# Patient Record
Sex: Female | Born: 1935 | Race: White | Hispanic: No | Smoking: Current every day smoker
Health system: Southern US, Community
[De-identification: ages and names within clinical notes are randomized; demographics above are authoritative.]

## PROBLEM LIST (undated history)

## (undated) DIAGNOSIS — K222 Esophageal obstruction: Secondary | ICD-10-CM

## (undated) DIAGNOSIS — I2510 Atherosclerotic heart disease of native coronary artery without angina pectoris: Secondary | ICD-10-CM

## (undated) DIAGNOSIS — Z950 Presence of cardiac pacemaker: Secondary | ICD-10-CM

## (undated) DIAGNOSIS — E039 Hypothyroidism, unspecified: Secondary | ICD-10-CM

## (undated) DIAGNOSIS — I351 Nonrheumatic aortic (valve) insufficiency: Secondary | ICD-10-CM

## (undated) DIAGNOSIS — Z9889 Other specified postprocedural states: Secondary | ICD-10-CM

## (undated) DIAGNOSIS — D1720 Benign lipomatous neoplasm of skin and subcutaneous tissue of unspecified limb: Secondary | ICD-10-CM

## (undated) DIAGNOSIS — E785 Hyperlipidemia, unspecified: Secondary | ICD-10-CM

## (undated) DIAGNOSIS — E079 Disorder of thyroid, unspecified: Secondary | ICD-10-CM

## (undated) DIAGNOSIS — Z973 Presence of spectacles and contact lenses: Secondary | ICD-10-CM

## (undated) DIAGNOSIS — Z972 Presence of dental prosthetic device (complete) (partial): Secondary | ICD-10-CM

## (undated) DIAGNOSIS — C801 Malignant (primary) neoplasm, unspecified: Secondary | ICD-10-CM

## (undated) DIAGNOSIS — I10 Essential (primary) hypertension: Secondary | ICD-10-CM

## (undated) DIAGNOSIS — J381 Polyp of vocal cord and larynx: Secondary | ICD-10-CM

## (undated) DIAGNOSIS — K449 Diaphragmatic hernia without obstruction or gangrene: Secondary | ICD-10-CM

## (undated) DIAGNOSIS — B029 Zoster without complications: Secondary | ICD-10-CM

## (undated) DIAGNOSIS — K2960 Other gastritis without bleeding: Secondary | ICD-10-CM

## (undated) DIAGNOSIS — I4891 Unspecified atrial fibrillation: Secondary | ICD-10-CM

## (undated) DIAGNOSIS — M1990 Unspecified osteoarthritis, unspecified site: Secondary | ICD-10-CM

## (undated) HISTORY — PX: CHOLECYSTECTOMY: SHX55

## (undated) HISTORY — DX: Presence of spectacles and contact lenses: Z97.3

## (undated) HISTORY — PX: BACK SURGERY: SHX140

## (undated) HISTORY — PX: ABDOMINAL HYSTERECTOMY: SHX81

## (undated) HISTORY — DX: Presence of cardiac pacemaker: Z95.0

## (undated) HISTORY — PX: GALLBLADDER SURGERY: SHX652

## (undated) HISTORY — PX: COLONOSCOPY: SHX174

## (undated) HISTORY — DX: Unspecified osteoarthritis, unspecified site: M19.90

## (undated) HISTORY — DX: Other specified postprocedural states: Z98.890

## (undated) HISTORY — DX: Benign lipomatous neoplasm of skin and subcutaneous tissue of unspecified limb: D17.20

## (undated) HISTORY — DX: Zoster without complications: B02.9

## (undated) HISTORY — DX: Presence of dental prosthetic device (complete) (partial): Z97.2

## (undated) HISTORY — PX: CATARACT EXTRACTION: SUR2

## (undated) HISTORY — DX: Disorder of thyroid, unspecified: E07.9

## (undated) HISTORY — PX: THYROID SURGERY: SHX805

## (undated) HISTORY — PX: ESOPHAGOGASTRODUODENOSCOPY: SHX1529

---

## 2007-02-03 ENCOUNTER — Emergency Department: Payer: Self-pay | Admitting: Emergency Medicine

## 2007-02-03 ENCOUNTER — Other Ambulatory Visit: Payer: Self-pay

## 2007-02-11 ENCOUNTER — Ambulatory Visit: Payer: Self-pay | Admitting: Specialist

## 2007-02-25 ENCOUNTER — Ambulatory Visit: Payer: Self-pay | Admitting: Unknown Physician Specialty

## 2007-03-29 ENCOUNTER — Ambulatory Visit: Payer: Self-pay | Admitting: Unknown Physician Specialty

## 2007-07-13 ENCOUNTER — Ambulatory Visit: Payer: Self-pay | Admitting: Unknown Physician Specialty

## 2009-04-09 ENCOUNTER — Ambulatory Visit: Payer: Self-pay | Admitting: Rheumatology

## 2009-06-18 ENCOUNTER — Ambulatory Visit: Payer: Self-pay | Admitting: Internal Medicine

## 2009-09-11 ENCOUNTER — Ambulatory Visit: Payer: Self-pay | Admitting: Pain Medicine

## 2009-10-01 ENCOUNTER — Ambulatory Visit: Payer: Self-pay | Admitting: Pain Medicine

## 2009-10-18 ENCOUNTER — Other Ambulatory Visit: Payer: Self-pay | Admitting: Pain Medicine

## 2009-10-18 ENCOUNTER — Ambulatory Visit: Payer: Self-pay | Admitting: Pain Medicine

## 2009-10-25 ENCOUNTER — Ambulatory Visit: Payer: Self-pay | Admitting: Pain Medicine

## 2009-11-12 ENCOUNTER — Ambulatory Visit: Payer: Self-pay | Admitting: Pain Medicine

## 2009-12-06 ENCOUNTER — Ambulatory Visit: Payer: Self-pay | Admitting: Pain Medicine

## 2009-12-25 ENCOUNTER — Ambulatory Visit: Payer: Self-pay | Admitting: Gastroenterology

## 2010-01-01 ENCOUNTER — Ambulatory Visit: Payer: Self-pay | Admitting: Gastroenterology

## 2010-01-31 ENCOUNTER — Ambulatory Visit: Payer: Self-pay | Admitting: Unknown Physician Specialty

## 2010-06-13 ENCOUNTER — Ambulatory Visit: Payer: Self-pay | Admitting: Internal Medicine

## 2010-07-08 ENCOUNTER — Ambulatory Visit: Payer: Self-pay | Admitting: Obstetrics and Gynecology

## 2010-07-16 ENCOUNTER — Emergency Department: Payer: Self-pay | Admitting: Unknown Physician Specialty

## 2010-08-20 ENCOUNTER — Ambulatory Visit: Payer: Self-pay | Admitting: Unknown Physician Specialty

## 2010-08-27 LAB — PATHOLOGY REPORT

## 2010-10-28 LAB — PROTIME-INR: INR: 1.09 (ref 0.00–1.49)

## 2010-10-28 LAB — COMPREHENSIVE METABOLIC PANEL
ALT: 12 U/L (ref 0–35)
AST: 17 U/L (ref 0–37)
Albumin: 3.6 g/dL (ref 3.5–5.2)
CO2: 29 mEq/L (ref 19–32)
Chloride: 104 mEq/L (ref 96–112)
GFR calc Af Amer: >60 mL/min (ref >60)
GFR calc non Af Amer: >60 mL/min (ref >60)
Potassium: 4.5 mEq/L (ref 3.5–5.1)
Sodium: 141 mEq/L (ref 135–145)
Total Bilirubin: 0.9 mg/dL (ref 0.3–1.2)

## 2010-10-28 LAB — URINALYSIS, ROUTINE W REFLEX MICROSCOPIC
Bilirubin Urine: NEGATIVE
Hgb urine dipstick: NEGATIVE
Ketones, ur: NEGATIVE mg/dL
Nitrite: NEGATIVE
Urobilinogen, UA: 0.2 mg/dL (ref 0.0–1.0)

## 2010-10-28 LAB — CBC
HCT: 40.4 % (ref 36.0–46.0)
Hemoglobin: 13.5 g/dL (ref 12.0–15.0)
MCV: 91.2 fL (ref 78.0–100.0)
RBC: 4.43 MIL/uL (ref 3.87–5.11)
WBC: 8.1 10*3/uL (ref 4.0–10.5)

## 2010-10-28 LAB — DIFFERENTIAL
Eosinophils Relative: 2 % (ref 0–5)
Lymphocytes Relative: 32 % (ref 12–46)
Lymphs Abs: 2.6 10*3/uL (ref 0.7–4.0)
Neutro Abs: 4.8 10*3/uL (ref 1.7–7.7)
Neutrophils Relative %: 60 % (ref 43–77)

## 2010-10-28 LAB — SURGICAL PCR SCREEN: MRSA, PCR: NEGATIVE

## 2010-10-28 LAB — URINE MICROSCOPIC-ADD ON

## 2010-11-04 ENCOUNTER — Observation Stay (HOSPITAL_COMMUNITY)
Admission: RE | Admit: 2010-11-04 | Discharge: 2010-11-05 | Disposition: A | Discharge status: Home / Self Care | Payer: Medicare Other | Attending: Surgery | Admitting: Surgery

## 2010-11-04 ENCOUNTER — Other Ambulatory Visit: Payer: Self-pay | Admitting: Surgery

## 2010-11-04 DIAGNOSIS — J449 Chronic obstructive pulmonary disease, unspecified: Secondary | ICD-10-CM | POA: Insufficient documentation

## 2010-11-04 DIAGNOSIS — J4489 Other specified chronic obstructive pulmonary disease: Secondary | ICD-10-CM | POA: Insufficient documentation

## 2010-11-04 DIAGNOSIS — I10 Essential (primary) hypertension: Secondary | ICD-10-CM | POA: Insufficient documentation

## 2010-11-04 DIAGNOSIS — I4891 Unspecified atrial fibrillation: Secondary | ICD-10-CM | POA: Insufficient documentation

## 2010-11-04 DIAGNOSIS — E041 Nontoxic single thyroid nodule: Principal | ICD-10-CM | POA: Insufficient documentation

## 2010-11-04 DIAGNOSIS — H409 Unspecified glaucoma: Secondary | ICD-10-CM | POA: Insufficient documentation

## 2010-11-04 DIAGNOSIS — F17200 Nicotine dependence, unspecified, uncomplicated: Secondary | ICD-10-CM | POA: Insufficient documentation

## 2010-11-04 DIAGNOSIS — Z01812 Encounter for preprocedural laboratory examination: Secondary | ICD-10-CM | POA: Insufficient documentation

## 2010-11-05 LAB — CALCIUM: Calcium: 9.1 mg/dL (ref 8.4–10.5)

## 2010-11-21 NOTE — Discharge Summary (Signed)
  NAMEDESTENI, PISCOPO               ACCOUNT NO.:  [id number]  MEDICAL RECORD NO.:  [id number]           PATIENT TYPE:  I  LOCATION:  1533                         FACILITY:  [facility]  PHYSICIAN:  Velora Heckler, MD      DATE OF BIRTH:  Feb 03, 1932  DATE OF ADMISSION:  11/04/2010 DATE OF DISCHARGE:  11/05/2010                              DISCHARGE SUMMARY   REASON FOR ADMISSION:  Thyroid nodule with indeterminate cytopathology  BRIEF HISTORY:  The patient is a 77 year old white female from [place], [place].  She has had a known history of thyroid nodules over the past 10 years.  She has had a gradually enlarging mass on the left side that now measures 4.6 cm.  She underwent fine-needle aspiration cytology which was indeterminate.  She now comes to Surgery for resection for definitive diagnosis.  BODY OF REPORT:  The patient was admitted on November 04, 2010, and taken directly to the operating room.  She underwent total thyroidectomy with resection of a left anterior mediastinal mass.  Postoperatively, she remained stable.  She tolerated a regular diet.  Her calcium levels remained stable at 9.0 and 9.1.  She was prepared for discharge home on the first postoperative day.  DISCHARGE/PLAN:  The patient is discharged home on November 05, 2010, in good condition, tolerating a regular diet, and ambulating independently.  DISCHARGE MEDICATIONS:  Include Ultram as needed for pain and Synthroid 75 mcg daily.  She will also take Tums 2 tablets 3 times daily.  The patient will return to see me at my office at [facility] in 2 to 3 weeks.  FINAL DIAGNOSIS:  Thyroid nodules, left neck mass, final pathologic results pending at the time of discharge.  CONDITION ON DISCHARGE:  Good.     Velora Heckler, MD     TMG/MEDQ  D:  11/05/2010  T:  11/05/2010  Job:  [id number]  cc:   Ellie Lunch, M.D.  Bethann Punches Fax: [phone]  Dr. Elliot Gurney Fax:  [phone]  Electronically Signed by Darnell Level MD on 11/21/2010 10:28:01 AM

## 2010-11-21 NOTE — Op Note (Signed)
NAMEJANIELLE, Elizabeth Boone               ACCOUNT NO.:  [id number]  MEDICAL RECORD NO.:  [id number]           PATIENT TYPE:  O  LOCATION:  DAYL                         FACILITY:  [facility]  PHYSICIAN:  Velora Heckler, MD      DATE OF BIRTH:  1931/12/06  DATE OF PROCEDURE: DATE OF DISCHARGE:                              OPERATIVE REPORT   PREOPERATIVE DIAGNOSIS:  Thyroid nodules.  POSTOPERATIVE DIAGNOSIS:  Thyroid nodules, left mediastinal mass.  PROCEDURE: 1. Total thyroidectomy. 2. Resection, left anterior mediastinal mass.  SURGEON:  Velora Heckler, MD  ANESTHESIA:  Jill Side, MD  ESTIMATED BLOOD LOSS:  100 mL.  PREPARATION:  ChloraPrep.  COMPLICATIONS:  None.  INDICATIONS:  The patient is a 77 year old white female from [place], [place].  She is referred by Dr. Ellie Lunch for evaluation of enlarging left-sided thyroid nodule with indeterminate cytopathology. The patient has had a history of thyroid nodules dating back approximately 10 years.  She has had intermittent problems with hoarseness which has become more significant over the past few months. The patient has been followed with ultrasound showing bilateral thyroid nodules as well as a dominant mass in the left lower neck.  The patient has had previous fine-needle aspiration biopsies at [facility] which showed indeterminate cytopathology.  The patient now comes to Surgery for resection.  BODY OF REPORT:  Procedure was done in OR #2 at the [facility].  The patient was brought to the operating room, placed in supine position on the operating room table.  Following administration of general anesthesia, the patient is positioned and then prepped and draped in the usual strict aseptic fashion.  After ascertaining that an adequate level of anesthesia has been achieved, a Kocher incision was made with a #15 blade.  Dissection was carried through subcutaneous tissues  and platysma.  Hemostasis was obtained with electrocautery.  Subplatysmal flaps were developed cephalad and caudad and Mahorner retractor was placed for exposure.  Strap muscles were incised in the midline and dissection was begun on the left side.  There was a relatively small left thyroid lobe.  Venous structures were divided between small and medium Ligaclips.  Superior pole was dissected out and superior pole vessels divided between small Ligaclips with the harmonic scalpel.  Branches of the inferior thyroid artery are divided between small Ligaclips.  Gland was mobilized and rotated anteriorly. Isthmus was elevated off of the trachea with electrocautery.  There was a small pyramidal lobe which was dissected off the anterior thyroid cartilage and resected en bloc with the left lobe and isthmus.  Further palpation in the left neck, however, shows an enlarged mass extending from just below the thyroid on the left into the anterior mediastinum.  This tracks along the lateral trachea and esophagus.  It displaces the recurrent nerve laterally and posteriorly.  With some difficulty, this mass is mobilized out of the anterior mediastinum. Harmonic scalpel was used to divide adventitia around the mass. Vascular structures were divided between Ligaclips with the harmonic scalpel.  The entire mass is mobilized up and into the neck.  It has the appearance of a multicystic ectopic thyroid tissue, although this was difficult to determine at the time of surgery.  This was fully mobilized and resected.  It is submitted separately labeled as left anterior mediastinal mass to pathology for review.  Good hemostasis was achieved in the left neck.  Dry pack is placed in the left neck.  Next, we turned our attention to the right neck.  Strap muscles were reflected laterally exposing the right thyroid lobe.  Middle thyroid vein is divided between medium Ligaclips with the harmonic scalpel. Superior  pole was dissected out and vessels were divided individually between small and medium Ligaclips with the harmonic scalpel.  Gland was rotated anteriorly.  Branches of the inferior thyroid artery are divided between small Ligaclips with the harmonic scalpel.  Recurrent nerve was identified and preserved.  Ligament of Allyson Sabal is released and the gland was mobilized up and onto the anterior trachea from which it is completely excised with the electrocautery.  Suture was used to mark the left superior pole.  Entire thyroid gland was submitted to pathology for review.  Neck is irrigated with warm saline.  Good hemostasis was achieved bilaterally.  Surgicel was placed in the operative field.  Strap muscles were reapproximated in the midline with interrupted 3-0 Vicryl sutures. Platysma was closed with interrupted 3-0 Vicryl sutures.  Skin was closed with running 4-0 Monocryl subcuticular suture.  Wound was washed and dried and Dermabond was applied.  The patient was awakened from anesthesia and brought to the recovery room.  The patient tolerated the procedure well.     Velora Heckler, MD     TMG/MEDQ  D:  11/04/2010  T:  11/04/2010  Job:  [id number]  cc:   Ellie Lunch, M.D.  Bethann Punches Fax: [phone]  Dr. Linus Salmons Gould D. Hegland, MD [address] [place], [place] [address]  Electronically Signed by Darnell Level MD on 11/21/2010 10:28:13 AM

## 2011-01-29 ENCOUNTER — Encounter (INDEPENDENT_AMBULATORY_CARE_PROVIDER_SITE_OTHER): Payer: Self-pay | Admitting: Surgery

## 2011-05-27 ENCOUNTER — Other Ambulatory Visit (INDEPENDENT_AMBULATORY_CARE_PROVIDER_SITE_OTHER): Payer: Self-pay | Admitting: Surgery

## 2011-05-27 NOTE — Telephone Encounter (Signed)
Patient will see Dr. Bethann Punches 05/28/2011. She has already had labs performed by him and will see him for future refills. RMP

## 2011-08-28 ENCOUNTER — Ambulatory Visit: Payer: Self-pay | Admitting: Unknown Physician Specialty

## 2011-08-28 DIAGNOSIS — I499 Cardiac arrhythmia, unspecified: Secondary | ICD-10-CM

## 2011-09-02 ENCOUNTER — Ambulatory Visit: Payer: Self-pay | Admitting: Unknown Physician Specialty

## 2011-09-15 ENCOUNTER — Ambulatory Visit: Payer: Self-pay | Admitting: Oncology

## 2011-09-19 ENCOUNTER — Ambulatory Visit: Payer: Self-pay | Admitting: Oncology

## 2011-09-30 ENCOUNTER — Ambulatory Visit: Payer: Self-pay | Admitting: Oncology

## 2011-10-28 LAB — CBC CANCER CENTER
Basophil #: 0 x10 3/mm (ref 0.0–0.1)
Basophil %: 0.4 %
Eosinophil %: 2.6 %
HCT: 40 % (ref 35.0–47.0)
Lymphocyte #: 2 x10 3/mm (ref 1.0–3.6)
MCHC: 33.9 g/dL (ref 32.0–36.0)
MCV: 92 fL (ref 80–100)
Monocyte %: 6.2 %
Neutrophil #: 4.7 x10 3/mm (ref 1.4–6.5)
Platelet: 171 x10 3/mm (ref 150–440)
RDW: 15.8 % — ABNORMAL HIGH (ref 11.5–14.5)
WBC: 7.5 x10 3/mm (ref 3.6–11.0)

## 2011-10-30 DIAGNOSIS — I480 Paroxysmal atrial fibrillation: Secondary | ICD-10-CM | POA: Insufficient documentation

## 2011-10-30 DIAGNOSIS — I4891 Unspecified atrial fibrillation: Secondary | ICD-10-CM | POA: Insufficient documentation

## 2011-10-31 ENCOUNTER — Ambulatory Visit: Payer: Self-pay | Admitting: Oncology

## 2011-11-11 LAB — CBC CANCER CENTER
Basophil %: 0.3 %
Eosinophil #: 0.1 x10 3/mm (ref 0.0–0.7)
Eosinophil %: 1.1 %
HGB: 13.7 g/dL (ref 12.0–16.0)
Lymphocyte %: 20 %
MCH: 31.1 pg (ref 26.0–34.0)
Monocyte #: 0.5 x10 3/mm (ref 0.0–0.7)
Neutrophil #: 5.7 x10 3/mm (ref 1.4–6.5)
Neutrophil %: 72.1 %
RBC: 4.41 10*6/uL (ref 3.80–5.20)

## 2011-11-28 ENCOUNTER — Ambulatory Visit: Payer: Self-pay | Admitting: Oncology

## 2011-12-29 ENCOUNTER — Ambulatory Visit: Payer: Self-pay | Admitting: Oncology

## 2012-01-28 ENCOUNTER — Ambulatory Visit: Payer: Self-pay | Admitting: Oncology

## 2012-06-08 ENCOUNTER — Ambulatory Visit: Payer: Self-pay | Admitting: Internal Medicine

## 2012-06-10 ENCOUNTER — Ambulatory Visit: Payer: Self-pay | Admitting: Oncology

## 2012-06-29 ENCOUNTER — Ambulatory Visit: Payer: Self-pay | Admitting: Oncology

## 2012-11-08 ENCOUNTER — Observation Stay: Payer: Self-pay | Admitting: Internal Medicine

## 2012-11-08 ENCOUNTER — Ambulatory Visit: Payer: Self-pay | Admitting: Emergency Medicine

## 2012-11-08 LAB — COMPREHENSIVE METABOLIC PANEL
Albumin: 3.5 g/dL (ref 3.4–5.0)
Alkaline Phosphatase: 111 U/L (ref 50–136)
BUN: 11 mg/dL (ref 7–18)
Bilirubin,Total: 0.6 mg/dL (ref 0.2–1.0)
Calcium, Total: 8.8 mg/dL (ref 8.5–10.1)
Creatinine: 0.5 mg/dL — ABNORMAL LOW (ref 0.60–1.30)
EGFR (African American): >60
Glucose: 85 mg/dL (ref 65–99)
Potassium: 4.6 mmol/L (ref 3.5–5.1)
SGPT (ALT): 14 U/L (ref 12–78)
Total Protein: 6.9 g/dL (ref 6.4–8.2)

## 2012-11-08 LAB — CBC
MCHC: 34 g/dL (ref 32.0–36.0)
MCV: 89 fL (ref 80–100)
RBC: 4.18 10*6/uL (ref 3.80–5.20)
WBC: 7 10*3/uL (ref 3.6–11.0)

## 2012-11-08 LAB — LIPASE, BLOOD: Lipase: 107 U/L (ref 73–393)

## 2012-11-08 LAB — CK TOTAL AND CKMB (NOT AT ARMC)
CK, Total: 85 U/L (ref 21–215)
CK, Total: 48 U/L (ref 21–215)

## 2012-11-09 LAB — BASIC METABOLIC PANEL
Anion Gap: 5 — ABNORMAL LOW (ref 7–16)
BUN: 13 mg/dL (ref 7–18)
Creatinine: 0.71 mg/dL (ref 0.60–1.30)
Osmolality: 275 (ref 275–301)

## 2012-11-09 LAB — LIPID PANEL
Cholesterol: 122 mg/dL (ref 0–200)
Ldl Cholesterol, Calc: 60 mg/dL (ref 0–100)

## 2012-11-09 LAB — TROPONIN I: Troponin-I: <0.02 ng/mL

## 2012-12-15 ENCOUNTER — Ambulatory Visit: Payer: Self-pay | Admitting: Gastroenterology

## 2013-01-03 ENCOUNTER — Ambulatory Visit: Payer: Self-pay | Admitting: Radiation Oncology

## 2013-01-27 ENCOUNTER — Ambulatory Visit: Payer: Self-pay | Admitting: Radiation Oncology

## 2013-02-09 ENCOUNTER — Ambulatory Visit: Payer: Self-pay | Admitting: Emergency Medicine

## 2013-03-15 ENCOUNTER — Ambulatory Visit: Payer: Self-pay

## 2013-06-17 ENCOUNTER — Ambulatory Visit: Payer: Self-pay | Admitting: Internal Medicine

## 2013-07-05 ENCOUNTER — Ambulatory Visit: Payer: Self-pay | Admitting: Radiation Oncology

## 2013-07-30 ENCOUNTER — Ambulatory Visit: Payer: Self-pay | Admitting: Radiation Oncology

## 2013-09-25 ENCOUNTER — Emergency Department: Payer: Self-pay | Admitting: Emergency Medicine

## 2013-09-28 LAB — BETA STREP CULTURE(ARMC)

## 2013-09-29 DIAGNOSIS — D1720 Benign lipomatous neoplasm of skin and subcutaneous tissue of unspecified limb: Secondary | ICD-10-CM

## 2013-09-29 HISTORY — DX: Benign lipomatous neoplasm of skin and subcutaneous tissue of unspecified limb: D17.20

## 2014-02-13 ENCOUNTER — Ambulatory Visit: Payer: Self-pay | Admitting: Physician Assistant

## 2014-02-19 ENCOUNTER — Ambulatory Visit: Payer: Self-pay | Admitting: Physician Assistant

## 2014-03-26 ENCOUNTER — Ambulatory Visit: Payer: Self-pay | Admitting: Physician Assistant

## 2014-06-15 ENCOUNTER — Ambulatory Visit: Payer: Self-pay | Admitting: Gastroenterology

## 2014-06-21 ENCOUNTER — Ambulatory Visit: Payer: Self-pay | Admitting: Internal Medicine

## 2014-07-05 ENCOUNTER — Ambulatory Visit: Payer: Self-pay | Admitting: Radiation Oncology

## 2014-07-30 ENCOUNTER — Ambulatory Visit: Payer: Self-pay | Admitting: Radiation Oncology

## 2014-08-04 ENCOUNTER — Ambulatory Visit: Payer: Self-pay | Admitting: Gastroenterology

## 2014-09-29 DIAGNOSIS — Z79899 Other long term (current) drug therapy: Secondary | ICD-10-CM | POA: Insufficient documentation

## 2014-11-03 ENCOUNTER — Ambulatory Visit: Payer: Self-pay | Admitting: Gastroenterology

## 2015-01-19 NOTE — Discharge Summary (Signed)
PATIENT NAME:  Elizabeth Boone, Elizabeth Boone MR#:  [id number] DATE OF BIRTH:  1932/04/20  DATE OF ADMISSION:  11/08/2012 DATE OF DISCHARGE:  11/09/2012  DISCHARGE DIAGNOSES: 1.  Atypical chest pain.  2.  Gastroesophageal reflux disease.  3.  Hyperlipidemia.  4.  Hypertension.  5.  Hypothyroidism.  6.  Chronic atrial fibrillation.  DISCHARGE MEDICATIONS:  Sertraline 50 mg daily, B complex daily, Synthroid 75 mcg daily, simvastatin 40 mg at bedtime, Toprol-XL 25 mg daily, tramadol 50 mg twice daily as needed, Xarelto 10 mg daily, Protonix 40 mg twice daily.   REASON FOR ADMISSION:  The patient is a 81 year old female presents with atypical chest pain.  Please see history and physical for history of present illness, past medical history, physical exam.   HOSPITAL COURSE:  The patient was admitted.  Enzymes were negative.  She describes a lot of burping and belching the last couple weeks, came to the ED, work-up was negative.  EKG showed chronic A-Fib with no ischemic changes, underwent Lexiscan which showed no ischemia.  Protonix twice daily will replace her omeprazole and she will follow up with Dr. Sabra Heck in two weeks.   DICTATION ENDS HERE    ____________________________ Rusty Aus, MD mfm:ea D: 11/09/2012 17:29:18 ET T: 11/09/2012 23:40:43 ET JOB#: [id number]  cc: Rusty Aus, MD, <Dictator> Cay[place] Kielbasa Roselee Culver MD ELECTRONICALLY SIGNED 11/10/2012 6:55

## 2015-01-19 NOTE — H&P (Signed)
PATIENT NAME:  Elizabeth Boone, Elizabeth Boone MR#:  [id number] DATE OF BIRTH:  Aug 04, 1932  DATE OF ADMISSION:  11/08/2012  PRIMARY CARE PROVIDER: Dr. Sabra Heck   ED REFERRING DOCTOR: Dr. Michel Santee  CHIEF COMPLAINT: Left-sided chest pain.   HISTORY OF PRESENT ILLNESS: The patient is a 81 year old white female with history of A. fib, coronary artery disease, hypertension, hyperlipidemia, hypothyroidism, previous history of vocal cord cancer, COPD, who was in her usual state of health until this morning when she woke up around 2:00 a.m. with left-sided chest pain.  It was sharp in nature and severe.  The patient reports that that then she got up and walked a little and then she had some belching and belched and the pain improved.  She has had some midepigastric pain and has had some on and off gas.  The patient has taken rolaids and Mylanta, has not helped much.  She reports that prior to this episode she has not had any chest pain or dyspnea on exertion.  She otherwise denies any nausea, vomiting or diarrhea.  Denies any lower extremity swelling.    PAST MEDICAL HISTORY:  1.  History of atrial fibrillation.  2.  Hypertension.  3.  Hyperlipidemia.  4.  Hypothyroidism.  5.  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  6.  Chronic obstructive pulmonary disease.  7.  Peripheral neuropathy.  8.  History of shingles.  9.  Status post pacemaker placement due to low heart rate.   PAST SURGICAL HISTORY:  1.  Status post thyroidectomy for thyroid cancer.  2.  Status post vocal cord biopsy.  3.  Bilateral cataract surgery.  4.  Status post cholecystectomy.  5.  Status post hysterectomy.  6.  Status post back surgery.   CURRENT MEDICATIONS: She is on:  1.  Levothyroxine 75 mcg daily.  2.  Metoprolol succinate extended-release 25 p.o. daily.  3.  Omeprazole 20 daily.  4.  Sertraline 50 one tab p.o. every morning.  5.  Simvastatin 40 every evening.  6.  Vitamin B-complex daily.  7.  Tramadol 50 one  tab p.o. b.i.d. as needed for pain.  8.  Xarelto 10 daily.   SOCIAL HISTORY: Smokes a 1/2 pack per day.  No alcohol or drug use.   FAMILY HISTORY: Positive for hypertension.   REVIEW OF SYSTEMS:  CONSTITUTIONAL: Complains of no fevers, no fatigue, no weakness, no weight loss, no weight gain.  EYES: No blurred or double vision.  No pain.  No redness.  No inflammation.  EARS, NOSE, AND THROAT: No tinnitus.  No ear pain.  No difficulty with hearing.  No difficulty swallowing.  RESPIRATORY: Denies any cough, wheezing, hemoptysis.  No COPD.  No TB.  CARDIOVASCULAR: Chest pain as above.  No orthopnea, no edema.  Does have a history of atrial fibrillation.  GASTROINTESTINAL: No nausea, vomiting, diarrhea.  Complains of some epigastric discomfort.  No hematemesis,  No melena.  No changes in bowel habits.  GENITOURINARY: Denies any dysuria, hematuria, renal calculus or frequency.  ENDOCRINOLOGIC: Denies any polyuria or nocturia but does have history of hypothyroidism.  HEME/LYMPH: Denies any anemia, easy bruisability or bleeding.  SKIN: No acne, no rash, no changes in mole, hair, or skin.  MUSCULOSKELETAL: Denies any pain in neck, back.  No gout.   NEUROLOGIC: No CVA.  No TIA.  No seizures.  PSYCHIATRIC: Does have depression.    PHYSICAL EXAMINATION:  VITAL SIGNS: Temperature 98, pulse 75, respirations 18, blood pressure 133/71.  O2  is 100%.  GENERAL: The patient is a 81 year old, elderly female in no acute distress.   HEAD, EYES, EARS, NOSE, AND THROAT: Head atraumatic, normocephalic.  Pupils equal, round, reactive to light and accommodation.  There is no conjunctiva pallor.  No scleral icterus.  Nasal exam shows no drainage or ulceration.  Oropharynx is clear without any exudate.  NECK: No thyromegaly.  No carotid bruits.  CARDIOVASCULAR: Regular rate and rhythm.  No murmurs, rubs, clicks, or gallops.  PMI is not displaced.  LUNGS: Clear to auscultation bilaterally without any rales, rhonchi, or  wheezing.  ABDOMEN: Soft, nontender, nondistended.  Positive bowel sounds x4.  There is no hepatosplenomegaly.   EXTREMITIES: No clubbing, cyanosis, or edema.  SKIN: No rash.  LYMPHATIC: No lymph nodes palpable.   VASCULAR: Good DP, PT pulses.  PSYCHIATRIC: Not anxious or depressed.  NEUROLOGIC: Awake, alert, and oriented x3.  No focal deficits.    EVALUATIONS: EKG shows paced rhythm without any other abnormalities.  Chest x-ray shows no acute cardiopulmonary processes.  WBC 7.0, hemoglobin 12.7, platelet count 183.  Troponin less than 0.02.  CPK is 85.  CK-MB 1.2.  Lipase 107.  BMP: Glucose 85, BUN 11, creatinine 0.50, sodium 140, potassium 4.6, chloride 108, CO2 is 26.  ALTs are normal.    ASSESSMENT AND PLAN: The patient is a 81 year old white female with history of atrial fibrillation status post pacemaker, hyperlipidemia, chronic obstructive pulmonary disease, presnts with chest pain.   1.  Chest pain, atypical, associated with some gastrointestinal symptoms but has multiple risk factors.  At this time, we will place the patient under observation.  Get a lexi mibi stress test in am  Place her on an aspirin.  2.  Atrial fibrillation: Will continue metoprolol and Xarelto.  3.  Hypertension: Continue the metoprolol as taking at home.  Blood pressure currently is stable.  4.  Hypothyroidism: Continue Synthroid.  5.  Hyperlipidemia: Continue simvastatin.  We will check a fasting lipid panel in the morning.  6.  Gastroesophageal reflux disease: Continue p.o. omeprazole.  7.  Flatulence and some gastrointestinal symptoms: We will try some Maalox with simethicone.    Note, 35 minutes spent on the history and physical.     ____________________________ Lafonda Mosses. Posey Pronto, MD shp:th D: 11/08/2012 16:46:42 ET T: 11/08/2012 18:13:56 ET JOB#: [id number]  cc: Jacqueleen Pulver H. Posey Pronto, MD, <Dictator> Alric Seton MD ELECTRONICALLY SIGNED 11/09/2012 10:23

## 2015-01-21 NOTE — Consult Note (Signed)
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   Referred by [place].  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 Tami Ribas. She presented with a 4 year history of increasing hoarseness has been a chronic smoker. She also has a history of a 30 pound weight loss over the past 3-4 years. She recently had laryngoscopy with biopsy showing severe dysplasia at least in situ squamous cell carcinoma. Case was reviewed at our tumor conference and recommendation was made for external beam radiation therapy to her larynx. She is seen today and is doing fairly well. She is having no dysphasia or head and neck pain. She continues to smoke.   Past Hx:    COPD:    Peripheral Neuropathy:    Hypothyroidism:    shingles: Oct 2011   HOH/Aids:    arrhythmia occasional atrial fib.:    Hypercholesterolemia:    HTN:    Thyroidectomy:    Vocal Cord Biopsy X 3:    Cataract Surgery Both Eyes:    Gall Bladder Surgery:    Hysterectomy:    [employer] Pacemaker:    back surgery:   Past, Family and Social History:   Past Medical History positive    Cardiovascular atrial fibrillation; hyperlipidemia; hypertension; pacemaker    Respiratory COPD    Endocrine hypothyroidism    Past Surgical History cholecystectomy; Hysterectomy, thyroidectomy, vocal cord biopsy x3    Past Medical History Comments Peripheral neuropathy, hearing aids    Family History noncontributory     Social History positive    Social History Comments Greater than 60-pack-year smoking history no significant BPH abuse history    Additional Past Medical and Surgical History Accompanied by daughter today   Allergies:   Oxycodone: Alt Ment Status  Codeine: N/V  Onions: N/V/Diarrhea  Adhesive: Other  Home Meds:  Home Medications:  traMADOL tablet 50 mg: 1 tab(s) orally 2 times a day, As Needed, Active  simvastatin 40 mg oral tablet: 1 tab(s) orally once a day after dinner, Active  naproxen 375 mg oral tablet: 1 tab(s) orally 2 times a day. Has stopped for surgery., Active  omeprazole 20 mg oral delayed release capsule: 1 tab(s) orally once a day (in the morning), Active  sertraline 50 mg oral tablet: 1 tab(s) orally once a day (in the morning), Active  Super B Complex: 1 tab(s) orally once a day after dinner, Active  levothyroxine 75 mcg (0.075 mg) oral tablet: 1 tab(s) orally once a day (in the morning), Active  furosemide 20 mg oral tablet: 1 tab(s) orally once a day (in the morning), As Needed, Active  Metoprolol Succinate ER 25 mg oral tablet, extended release: 1 tab(s) orally once a day (in the morning), Active  Aspirin 325 mg. : 1 tab(s) orally once a day, Active  Review of Systems:   General negative    Performance Status (ECOG) 0    Skin negative    Breast negative    Ophthalmologic negative  ENMT see HPI    Respiratory and Thorax see HPI    Cardiovascular see HPI    Gastrointestinal negative    Genitourinary negative    Musculoskeletal negative    Neurological see HPI    Psychiatric negative    Hematology/Lymphatics negative    Endocrine see HPI    Allergic/Immunologic negative   Nursing Notes:  Nursing Vital Signs and Chemo Nursing Nursing Notes: *CC Vital Signs Flowsheet:   09-Jan-13 15:01   Temp Temperature 97.2   Pulse Pulse 85   Respirations Respirations 20   SBP SBP 102   DBP DBP 67   Pain Scale (0-10)  0   Current Weight (kg) (kg)  70.5   Height (cm) centimeters 175.2   BSA (m2) 1.8   Physical Exam:  General/Skin/HEENT:   General normal    Skin normal    Eyes normal    Additional PE Well-developed female in NAD. Wears bilateral hearing aids. Oral cavity is clear. Patient is edentulous. No oral mucosal lesions are identified. Indirect mirror examination shows mobile cords approximating well. I do not see significant with leukoplakia this time. There some shagginess to the vocal cords bilaterally. Remainder of head and neck exam is unremarkable vallecula and base of tongue within normal limits. Neck is clear without evidence of cervical digastric or supraclavicular adenopathy. 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 Would plan on delivering 6400 cGy in 32 fractions using three-dimensional treatment planning. We have presented her case her weekly tumor conference and pathology was reviewed. Certainly from a tangential cuts it is impossible to tell whether there is in fact invasive squamous cell carcinoma but there is at least in situ carcinoma present. She also has some subglottic extension biopsy-proven report. I favor going ahead with external beam radiation therapy at this time. Risks and benefits of treatment including sore throat and some minor dysphasia was explained to the patient and her daughter and both seem to comprehend her treatment plan well. I have set her up for CT simulation early next week. I do not believe we need to do a PET/CT since the likelihood of any local metastatic  nodes involved would be extremely remote.  I would like to take this opportunity to thank you for allowing me to continue to participate in this patient's care.   CC Referral:   cc: Dr. Tami Ribas, Dr. Emily Filbert   Electronic Signatures: Baruch Gouty, Roda Shutters (MD)  (Signed 09-Jan-13 16:10)  Authored: HPI, Diagnosis, Past Hx, PFSH, Allergies, Home Meds, ROS, Nursing Notes, Physical Exam, Encounter Assessment and Plan, CC Referring Physician   Last Updated: 09-Jan-13 16:10 by Armstead Peaks (MD)

## 2015-01-22 LAB — SURGICAL PATHOLOGY

## 2015-02-08 DIAGNOSIS — Z8521 Personal history of malignant neoplasm of larynx: Secondary | ICD-10-CM | POA: Insufficient documentation

## 2015-02-08 DIAGNOSIS — Z8589 Personal history of malignant neoplasm of other organs and systems: Secondary | ICD-10-CM | POA: Insufficient documentation

## 2015-02-27 DIAGNOSIS — E782 Mixed hyperlipidemia: Secondary | ICD-10-CM | POA: Insufficient documentation

## 2015-07-19 ENCOUNTER — Encounter: Payer: Self-pay | Admitting: Radiation Oncology

## 2015-07-19 ENCOUNTER — Ambulatory Visit
Admission: RE | Admit: 2015-07-19 | Discharge: 2015-07-19 | Disposition: A | Discharge status: Home / Self Care | Payer: Medicare Other | Source: Ambulatory Visit | Attending: Radiation Oncology | Admitting: Radiation Oncology

## 2015-07-19 ENCOUNTER — Other Ambulatory Visit: Payer: Self-pay | Admitting: Internal Medicine

## 2015-07-19 VITALS — BP 113/74 | HR 86 | Temp 96.5°F | Wt 150.0 lb

## 2015-07-19 DIAGNOSIS — C329 Malignant neoplasm of larynx, unspecified: Secondary | ICD-10-CM

## 2015-07-19 DIAGNOSIS — R1084 Generalized abdominal pain: Secondary | ICD-10-CM

## 2015-07-19 DIAGNOSIS — R519 Headache, unspecified: Secondary | ICD-10-CM

## 2015-07-19 DIAGNOSIS — R51 Headache: Secondary | ICD-10-CM

## 2015-07-19 DIAGNOSIS — R634 Abnormal weight loss: Secondary | ICD-10-CM

## 2015-07-19 NOTE — Progress Notes (Signed)
Radiation Oncology Follow up Note  Name: Elizabeth Boone   Date:   07/19/2015 MRN:  [id number] DOB: [birth year]    This 81 y.o. female presents to the clinic today for follow-up for head and neck cancer squamous cell carcinoma the larynx.  REFERRING PROVIDER: No ref. provider found  HPI: Patient is a 81 year old female now out for half years having completed radiation therapy to her larynx for at least in situ squamous cell carcinoma. She is seen today and is doing well. She specifically denies head and neck pain or dysphagia. Voice continues to be somewhat raspy but is unchanged over the past year..  COMPLICATIONS OF TREATMENT: none  FOLLOW UP COMPLIANCE: keeps appointments   PHYSICAL EXAM:  BP 113/74 mmHg  Pulse 86  Temp(Src) 96.5 F (35.8 C)  Wt 150 lb 0.4 oz (68.05 kg) Oral cavity is clear no oral mucosal lesions are identified. Indirect mirror examination shows some slight swelling of the cords although both are mobile. Vallecula and base of tongue within normal limits. Neck is clear without evidence of subject gastric cervical or supraclavicular adenopathy. She's had Mohs chemosurgery in the right nasal bridge I see nothing to suggest recurrent disease in that region. Well-developed well-nourished patient in NAD. HEENT reveals PERLA, EOMI, discs not visualized.  Oral cavity is clear. No oral mucosal lesions are identified. Neck is clear without evidence of cervical or supraclavicular adenopathy. Lungs are clear to A&P. Cardiac examination is essentially unremarkable with regular rate and rhythm without murmur rub or thrill. Abdomen is benign with no organomegaly or masses noted. Motor sensory and DTR levels are equal and symmetric in the upper and lower extremities. Cranial nerves II through XII are grossly intact. Proprioception is intact. No peripheral adenopathy or edema is identified. No motor or sensory levels are noted. Crude visual fields are within normal range.  RADIOLOGY  RESULTS: No current films for review  PLAN: At the present time she continues to do well with no evidence of disease. I will see her back in 1 year for follow-up and then discontinue follow-up care. She continues close follow-up care with ENT. Patient family know to call with any concerns.    Armstead Peaks., MD

## 2015-07-24 ENCOUNTER — Ambulatory Visit
Admission: RE | Admit: 2015-07-24 | Discharge: 2015-07-24 | Disposition: A | Discharge status: Home / Self Care | Payer: Medicare Other | Source: Ambulatory Visit | Attending: Internal Medicine | Admitting: Internal Medicine

## 2015-07-24 DIAGNOSIS — R9082 White matter disease, unspecified: Secondary | ICD-10-CM | POA: Insufficient documentation

## 2015-07-24 DIAGNOSIS — R1084 Generalized abdominal pain: Secondary | ICD-10-CM

## 2015-07-24 DIAGNOSIS — R51 Headache: Secondary | ICD-10-CM

## 2015-07-24 DIAGNOSIS — G319 Degenerative disease of nervous system, unspecified: Secondary | ICD-10-CM | POA: Insufficient documentation

## 2015-07-24 DIAGNOSIS — R634 Abnormal weight loss: Secondary | ICD-10-CM | POA: Insufficient documentation

## 2015-07-24 DIAGNOSIS — R519 Headache, unspecified: Secondary | ICD-10-CM

## 2015-07-24 HISTORY — DX: Essential (primary) hypertension: I10

## 2015-07-24 HISTORY — DX: Malignant (primary) neoplasm, unspecified: C80.1

## 2015-07-24 MED ORDER — IOHEXOL 350 MG/ML SOLN
75.0000 mL | Freq: Once | INTRAVENOUS | Status: AC | PRN
  Administered 2015-07-24: 75 mL via INTRAVENOUS

## 2015-09-13 ENCOUNTER — Other Ambulatory Visit: Payer: Self-pay

## 2015-09-13 DIAGNOSIS — I495 Sick sinus syndrome: Secondary | ICD-10-CM | POA: Insufficient documentation

## 2015-09-13 DIAGNOSIS — E039 Hypothyroidism, unspecified: Secondary | ICD-10-CM | POA: Insufficient documentation

## 2015-09-13 DIAGNOSIS — I10 Essential (primary) hypertension: Secondary | ICD-10-CM | POA: Insufficient documentation

## 2015-09-18 ENCOUNTER — Ambulatory Visit (INDEPENDENT_AMBULATORY_CARE_PROVIDER_SITE_OTHER): Payer: Medicare Other | Admitting: General Surgery

## 2015-09-18 ENCOUNTER — Encounter: Payer: Self-pay | Admitting: General Surgery

## 2015-09-18 VITALS — BP 117/69 | HR 86 | Temp 97.3°F | Ht 69.0 in | Wt 154.0 lb

## 2015-09-18 DIAGNOSIS — M799 Soft tissue disorder, unspecified: Secondary | ICD-10-CM

## 2015-09-18 DIAGNOSIS — M7989 Other specified soft tissue disorders: Secondary | ICD-10-CM

## 2015-09-18 NOTE — Progress Notes (Signed)
Patient ID: Elizabeth Boone, female   DOB: 1932/02/17, 81 y.o.   MRN: [id number]  CC: Left hip soft tissue mass  HPI Elizabeth Boone is a 81 y.o. female presents to clinic for evaluation of a soft tissue mass over her left hip. Patient states that he was recently evaluated by her dermatologist who told her that she should have a checked by a surgeon. Patient states that she first noticed area after a significant fall and trauma to the area. The area is not currently painful. It has never been red and has never had any drainage. Patient thinks that the area is actually decreased in size since she first noticed it. No other significant symptoms report from the area. She currently denies any nausea, vomiting, chills, fevers, chest pain, short of breath, diarrhea, constipation.  HPI  Past Medical History  Diagnosis Date  . Previous back surgery   . Pacemaker   . Shingles   . Thyroid disease   . Wears glasses   . Hearing loss   . Arthritis   . Wears dentures   . Hypertension   . 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 Lipoma of thigh 2015    Outer    Past Surgical History  Procedure Laterality Date  . Abdominal hysterectomy    . Back surgery    . Gallbladder surgery    . Thyroid surgery      History reviewed. No pertinent family history.  Social History Social History  Substance Use Topics  . Smoking status: Current Every Day Smoker -- 1.00 packs/day  . Smokeless tobacco: None  . Alcohol Use: No    Allergies  Allergen Reactions  . Codeine   . Hydrocodone Nausea Only    Current Outpatient Prescriptions  Medication Sig Dispense Refill  . levothyroxine (SYNTHROID, LEVOTHROID) 75 MCG tablet Take 1 tablet by mouth daily.  3  . metoprolol succinate (TOPROL-XL) 25 MG 24 hr tablet     . omeprazole (PRILOSEC) 20 MG capsule Take 20 mg by mouth daily.      Marland Kitchen rOPINIRole (REQUIP) 0.25 MG tablet Take by mouth.    . sertraline (ZOLOFT) 50 MG tablet Take 50 mg by mouth daily.      .  simvastatin (ZOCOR) 40 MG tablet Take 40 mg by mouth at bedtime.      . sucralfate (CARAFATE) 1 G tablet     . traMADol (ULTRAM) 50 MG tablet Take 50 mg by mouth every 6 (six) hours as needed.      Elizabeth Boone 20 MG TABS tablet Take 1 tablet by mouth daily.     No current facility-administered medications for this visit.     Review of Systems A tie point review of systems was asked and was negative except for the findings documented in the history of present illness  Physical Exam Blood pressure 117/69, pulse 86, temperature 97.3 F (36.3 C), temperature source Oral, height 5\' 9"  (1.753 m), weight 69.854 kg (154 lb). CONSTITUTIONAL: No acute distress. EYES: Pupils are equal, round, and reactive to light, Sclera are non-icteric. EARS, NOSE, MOUTH AND THROAT: The oropharynx is clear. The oral mucosa is pink and moist. Hearing is intact to voice. LYMPH NODES:  Lymph nodes in the neck are normal. RESPIRATORY:  Lungs are clear. There is normal respiratory effort, with equal breath sounds bilaterally, and without pathologic use of accessory muscles. CARDIOVASCULAR: Heart is regular without murmurs, gallops, or rubs. GI: The abdomen is soft, nontender,  and nondistended. There are no palpable masses. There is no hepatosplenomegaly. There are normal bowel sounds in all quadrants. GU: Rectal deferred.   MUSCULOSKELETAL: Normal muscle strength and tone. No cyanosis or edema.   SKIN: 2 cm diameter soft tissue mass palpated over the left femoral head. It is soft and mobile. This is consistent with a benign lipoma or other benign soft tissue mass. NEUROLOGIC: Motor and sensation is grossly normal. Cranial nerves are grossly intact. PSYCH:  Oriented to person, place and time. Affect is normal.  Data Reviewed No images and labs reviewed. I have personally reviewed the patient's imaging, laboratory findings and medical records.    Assessment    Left hip soft tissue mass    Plan    Soft tissue  mass: Discussed with the patient and her daughter the likelihood that this is a benign lipoma. Discussed indications for surgical excision of lipoma include pain, increasing in size, appearance. Discussed the patient might have to want to have the surgery performed at this stage. Patient voiced that she's not currently interested in surgical excision. Discussed with the patient that should the area grow, become painful or should she be, worried enough to want surgical excision that she can return to clinic at any time to plan for its removal. Patient voiced understanding and will follow up on an as need basis.     Time spent with the patient was 30 minutes, with more than 50% of the time spent in face-to-face education, counseling and care coordination.     Clayburn Pert, MD FACS General Surgeon 09/18/2015, 3:42 PM

## 2015-09-18 NOTE — Patient Instructions (Signed)
Keep an eye on the lipoma. If it grows or if it starts to bother you for the next 3 months, please give [place] a call to see you again.

## 2015-09-27 ENCOUNTER — Encounter: Payer: Self-pay | Admitting: *Deleted

## 2015-09-28 ENCOUNTER — Encounter
Admission: RE | Disposition: A | Discharge status: Home / Self Care | Payer: Self-pay | Source: Ambulatory Visit | Attending: Gastroenterology

## 2015-09-28 ENCOUNTER — Encounter: Payer: Self-pay | Admitting: Anesthesiology

## 2015-09-28 ENCOUNTER — Ambulatory Visit
Admission: RE | Admit: 2015-09-28 | Discharge: 2015-09-28 | Disposition: A | Discharge status: Home / Self Care | Payer: Medicare Other | Source: Ambulatory Visit | Attending: Gastroenterology | Admitting: Gastroenterology

## 2015-09-28 DIAGNOSIS — Z539 Procedure and treatment not carried out, unspecified reason: Secondary | ICD-10-CM | POA: Insufficient documentation

## 2015-09-28 DIAGNOSIS — D649 Anemia, unspecified: Secondary | ICD-10-CM | POA: Insufficient documentation

## 2015-09-28 HISTORY — DX: Unspecified atrial fibrillation: I48.91

## 2015-09-28 HISTORY — DX: Other gastritis without bleeding: K29.60

## 2015-09-28 HISTORY — DX: Esophageal obstruction: K22.2

## 2015-09-28 HISTORY — DX: Hyperlipidemia, unspecified: E78.5

## 2015-09-28 HISTORY — DX: Hypothyroidism, unspecified: E03.9

## 2015-09-28 HISTORY — DX: Diaphragmatic hernia without obstruction or gangrene: K44.9

## 2015-09-28 HISTORY — DX: Presence of cardiac pacemaker: Z95.0

## 2015-09-28 HISTORY — DX: Atherosclerotic heart disease of native coronary artery without angina pectoris: I25.10

## 2015-09-28 HISTORY — DX: Polyp of vocal cord and larynx: J38.1

## 2015-09-28 HISTORY — DX: Nonrheumatic aortic (valve) insufficiency: I35.1

## 2015-09-28 SURGERY — COLONOSCOPY WITH PROPOFOL
Anesthesia: General

## 2015-10-19 ENCOUNTER — Encounter
Admission: RE | Disposition: A | Discharge status: Home / Self Care | Payer: Self-pay | Source: Ambulatory Visit | Attending: Gastroenterology

## 2015-10-19 ENCOUNTER — Ambulatory Visit: Payer: Medicare Other | Admitting: *Deleted

## 2015-10-19 ENCOUNTER — Encounter: Payer: Self-pay | Admitting: *Deleted

## 2015-10-19 ENCOUNTER — Ambulatory Visit
Admission: RE | Admit: 2015-10-19 | Discharge: 2015-10-19 | Disposition: A | Discharge status: Home / Self Care | Payer: Medicare Other | Source: Ambulatory Visit | Attending: Gastroenterology | Admitting: Gastroenterology

## 2015-10-19 DIAGNOSIS — I4891 Unspecified atrial fibrillation: Secondary | ICD-10-CM | POA: Diagnosis not present

## 2015-10-19 DIAGNOSIS — K5730 Diverticulosis of large intestine without perforation or abscess without bleeding: Secondary | ICD-10-CM | POA: Insufficient documentation

## 2015-10-19 DIAGNOSIS — I351 Nonrheumatic aortic (valve) insufficiency: Secondary | ICD-10-CM | POA: Diagnosis not present

## 2015-10-19 DIAGNOSIS — Z7901 Long term (current) use of anticoagulants: Secondary | ICD-10-CM | POA: Insufficient documentation

## 2015-10-19 DIAGNOSIS — Z8521 Personal history of malignant neoplasm of larynx: Secondary | ICD-10-CM | POA: Diagnosis not present

## 2015-10-19 DIAGNOSIS — K219 Gastro-esophageal reflux disease without esophagitis: Secondary | ICD-10-CM | POA: Insufficient documentation

## 2015-10-19 DIAGNOSIS — R634 Abnormal weight loss: Secondary | ICD-10-CM | POA: Diagnosis present

## 2015-10-19 DIAGNOSIS — K222 Esophageal obstruction: Secondary | ICD-10-CM | POA: Diagnosis not present

## 2015-10-19 DIAGNOSIS — Z950 Presence of cardiac pacemaker: Secondary | ICD-10-CM | POA: Diagnosis not present

## 2015-10-19 DIAGNOSIS — M1991 Primary osteoarthritis, unspecified site: Secondary | ICD-10-CM | POA: Insufficient documentation

## 2015-10-19 DIAGNOSIS — I10 Essential (primary) hypertension: Secondary | ICD-10-CM | POA: Diagnosis not present

## 2015-10-19 DIAGNOSIS — E039 Hypothyroidism, unspecified: Secondary | ICD-10-CM | POA: Insufficient documentation

## 2015-10-19 DIAGNOSIS — I2510 Atherosclerotic heart disease of native coronary artery without angina pectoris: Secondary | ICD-10-CM | POA: Insufficient documentation

## 2015-10-19 DIAGNOSIS — I781 Nevus, non-neoplastic: Secondary | ICD-10-CM | POA: Insufficient documentation

## 2015-10-19 DIAGNOSIS — D649 Anemia, unspecified: Secondary | ICD-10-CM | POA: Insufficient documentation

## 2015-10-19 DIAGNOSIS — E079 Disorder of thyroid, unspecified: Secondary | ICD-10-CM | POA: Diagnosis not present

## 2015-10-19 DIAGNOSIS — E785 Hyperlipidemia, unspecified: Secondary | ICD-10-CM | POA: Insufficient documentation

## 2015-10-19 DIAGNOSIS — K449 Diaphragmatic hernia without obstruction or gangrene: Secondary | ICD-10-CM | POA: Diagnosis not present

## 2015-10-19 HISTORY — PX: ESOPHAGOGASTRODUODENOSCOPY: SHX5428

## 2015-10-19 HISTORY — PX: COLONOSCOPY WITH PROPOFOL: SHX5780

## 2015-10-19 SURGERY — COLONOSCOPY WITH PROPOFOL
Anesthesia: General

## 2015-10-19 MED ORDER — SODIUM CHLORIDE 0.9 % IV SOLN: INTRAVENOUS | Status: DC

## 2015-10-19 MED ORDER — IPRATROPIUM-ALBUTEROL 0.5-2.5 (3) MG/3ML IN SOLN: 3.0000 mL | Freq: Once | RESPIRATORY_TRACT | Status: DC

## 2015-10-19 MED ORDER — PROPOFOL 500 MG/50ML IV EMUL
INTRAVENOUS | Status: DC | PRN
  Administered 2015-10-19: 100 ug/kg/min via INTRAVENOUS

## 2015-10-19 MED ORDER — SODIUM CHLORIDE 0.9 % IV SOLN
INTRAVENOUS | Status: DC
  Administered 2015-10-19: 15:00:00 via INTRAVENOUS
  Administered 2015-10-19: 1000 mL via INTRAVENOUS

## 2015-10-19 MED ORDER — PROPOFOL 10 MG/ML IV BOLUS
INTRAVENOUS | Status: DC | PRN
  Administered 2015-10-19: 10 mg via INTRAVENOUS
  Administered 2015-10-19: 40 mg via INTRAVENOUS
  Administered 2015-10-19: 30 mg via INTRAVENOUS

## 2015-10-19 NOTE — Anesthesia Postprocedure Evaluation (Signed)
Anesthesia Post Note  Patient: Elizabeth Boone  Procedure(s) Performed: Procedure(s) (LRB): COLONOSCOPY WITH PROPOFOL (N/A) ESOPHAGOGASTRODUODENOSCOPY (EGD)  Patient location during evaluation: PACU Anesthesia Type: General Level of consciousness: awake Pain management: pain level controlled Vital Signs Assessment: post-procedure vital signs reviewed and stable Respiratory status: spontaneous breathing Cardiovascular status: blood pressure returned to baseline Postop Assessment: no headache Anesthetic complications: no    Last Vitals:  Filed Vitals:   10/19/15 1309  BP: 135/76  Pulse: 81  Temp: 36.6 C  Resp: 16    Last Pain: There were no vitals filed for this visit.               Clent Damore M

## 2015-10-19 NOTE — Anesthesia Preprocedure Evaluation (Signed)
Anesthesia Evaluation  Patient identified by MRN, date of birth, ID band Patient awake    Reviewed: Allergy & Precautions, NPO status , Patient's Chart, lab work & pertinent test results  Airway Mallampati: II  TM Distance: >3 FB Neck ROM: Limited    Dental  (+) Edentulous Upper   Pulmonary Current Smoker,    Pulmonary exam normal        Cardiovascular Exercise Tolerance: Poor hypertension, Pt. on medications and Pt. on home beta blockers + CAD  + pacemaker  Rhythm:Irregular Rate:Normal     Neuro/Psych    GI/Hepatic hiatal hernia, PUD,   Endo/Other  Hypothyroidism Treated.  Renal/GU      Musculoskeletal  (+) Arthritis , Osteoarthritis,    Abdominal (+)  Abdomen: soft.    Peds  Hematology   Anesthesia Other Findings   Reproductive/Obstetrics                             Anesthesia Physical Anesthesia Plan  ASA: IV  Anesthesia Plan: General   Post-op Pain Management:    Induction: Intravenous  Airway Management Planned: Nasal Cannula  Additional Equipment:   Intra-op Plan:   Post-operative Plan:   Informed Consent: I have reviewed the patients History and Physical, chart, labs and discussed the procedure including the risks, benefits and alternatives for the proposed anesthesia with the patient or authorized representative who has indicated his/her understanding and acceptance.   Consent reviewed with POA  Plan Discussed with: CRNA  Anesthesia Plan Comments:         Anesthesia Quick Evaluation

## 2015-10-19 NOTE — Op Note (Signed)
[facility] Gastroenterology Patient Name: Elizabeth Boone Procedure Date: 10/19/2015 2:56 PM MRN: [id number] Account #: [id number] Date of Birth: 04-Dec-1931 Admit Type: Outpatient Age: 82 Room: [facility] ENDO ROOM 3 Gender: Female Note Status: Finalized Procedure:         Upper GI endoscopy Indications:       Anemia, Weight loss Providers:         Lollie Sails, MD Referring MD:      Rusty Aus, MD (Referring MD) Medicines:         Monitored Anesthesia Care Complications:     No immediate complications. Procedure:         Pre-Anesthesia Assessment:                    - ASA Grade Assessment: III - A patient with severe                     systemic disease.                    After obtaining informed consent, the endoscope was passed                     under direct vision. Throughout the procedure, the                     patient's blood pressure, pulse, and oxygen saturations                     were monitored continuously. The Endoscope was introduced                     through the mouth, and advanced to the second part of                     duodenum. The upper GI endoscopy was accomplished without                     difficulty. The patient tolerated the procedure well. Findings:      One severe (stenosis; an endoscope cannot pass) benign-appearing, post       radiation intrinsic stenosis was found. The GIF 190 gastroscope would       not pass, and a GFX 180 N was used for evaluation. The stenosis was at       the level/somewhat above the level of the cricopharyngeus. There were       multiple telangiectasias at this level, with small ecchymosis. The       smaller diameter endoscope passed the stenosis withtout difficulty.      A small hiatus hernia was present.      The exam of the esophagus was otherwise normal.      The entire examined stomach was normal.      The examined duodenum was normal.      The cardia and gastric fundus were normal on  retroflexion. Impression:        - Benign-appearing esophageal stenosis.                    - Small hiatus hernia.                    - Normal stomach.                    - Normal examined duodenum.                    -  No specimens collected. Recommendation:    - Continue present medications.                    - Perform a colonoscopy today. Procedure Code(s): --- Professional ---                    [phone], Esophagogastroduodenoscopy, flexible, transoral;                     diagnostic, including collection of specimen(s) by                     brushing or washing, when performed (separate procedure) Diagnosis Code(s): --- Professional ---                    K22.2, Esophageal obstruction                    K44.9, Diaphragmatic hernia without obstruction or gangrene                    D64.9, Anemia, unspecified                    R63.4, Abnormal weight loss CPT copyright 2014 American Medical Association. All rights reserved. The codes documented in this report are preliminary and upon coder review may  be revised to meet current compliance requirements. Lollie Sails, MD 10/19/2015 3:25:04 PM This report has been signed electronically. Number of Addenda: 0 Note Initiated On: 10/19/2015 2:56 PM      [facility]

## 2015-10-19 NOTE — Transfer of Care (Signed)
Immediate Anesthesia Transfer of Care Note  Patient: Elizabeth Boone  Procedure(s) Performed: Procedure(s): COLONOSCOPY WITH PROPOFOL (N/A) ESOPHAGOGASTRODUODENOSCOPY (EGD)  Patient Location: Endoscopy Unit  Anesthesia Type:General  Level of Consciousness: awake, alert  and oriented  Airway & Oxygen Therapy: Patient Spontanous Breathing and Patient connected to nasal cannula oxygen  Post-op Assessment: Report given to RN and Post -op Vital signs reviewed and stable  Post vital signs: Reviewed and stable  Last Vitals:  Filed Vitals:   10/19/15 1309  BP: 135/76  Pulse: 81  Temp: 36.6 C  Resp: 16    Complications: No apparent anesthesia complications

## 2015-10-19 NOTE — Anesthesia Procedure Notes (Signed)
Performed by: Chenise Mulvihill Pre-anesthesia Checklist: Patient identified, Emergency Drugs available, Suction available, Patient being monitored and Timeout performed Oxygen Delivery Method: Nasal cannula       

## 2015-10-19 NOTE — Op Note (Signed)
[facility] Gastroenterology Patient Name: Elizabeth Boone Procedure Date: 10/19/2015 2:55 PM MRN: [id number] Account #: [id number] Date of Birth: 11/03/1931 Admit Type: Outpatient Age: 82 Room: [facility] ENDO ROOM 3 Gender: Female Note Status: Finalized Procedure:         Colonoscopy Indications:       Anemia, Weight loss Providers:         Lollie Sails, MD Referring MD:      Rusty Aus, MD (Referring MD) Medicines:         Monitored Anesthesia Care Complications:     No immediate complications. Procedure:         Pre-Anesthesia Assessment:                    - ASA Grade Assessment: III - A patient with severe                     systemic disease.                    After obtaining informed consent, the colonoscope was                     passed under direct vision. Throughout the procedure, the                     patient's blood pressure, pulse, and oxygen saturations                     were monitored continuously. The Colonoscope was                     introduced through the anus with the intention of                     advancing to the cecum. The scope was advanced to the                     descending colon before the procedure was aborted.                     Medications were given. The colonoscopy was unusually                     difficult due to multiple diverticula in the colon, poor                     bowel prep and a tortuous colon. Poor sphincter tone with                     difficulty to maintain insufflation. The patient tolerated                     the procedure well. The quality of the bowel preparation                     was fair except the descending colon was unsatisfactory. Findings:      Multiple small and large-mouthed diverticula were found in the sigmoid       colon and in the descending colon.      The digital rectal exam was normal. Pertinent negatives include Poor       sphincter tone. Impression:        - Diverticulosis  in the sigmoid colon and in the  descending colon.                    - No specimens collected. Recommendation:    Return to GI clinic in 3 weeks. Consider alternative to                     colonoscopy such as ABCE or CT Colonography, although                     these will also be difficult due to poor retention of air.                    - Return to GI office in 3 weeks. Procedure Code(s): --- Professional ---                    [id number], Sigmoidoscopy, flexible; diagnostic, including                     collection of specimen(s) by brushing or washing, when                     performed (separate procedure) Diagnosis Code(s): --- Professional ---                    D64.9, Anemia, unspecified                    R63.4, Abnormal weight loss                    K57.30, Diverticulosis of large intestine without                     perforation or abscess without bleeding CPT copyright 2014 American Medical Association. All rights reserved. The codes documented in this report are preliminary and upon coder review may  be revised to meet current compliance requirements. Lollie Sails, MD 10/19/2015 4:01:03 PM This report has been signed electronically. Number of Addenda: 0 Note Initiated On: 10/19/2015 2:55 PM Total Procedure Duration: 0 hours 23 minutes 1 second       [facility]

## 2015-10-19 NOTE — H&P (Addendum)
Outpatient short stay form Pre-procedure 10/19/2015 2:43 PM Elizabeth Sails MD  Primary Physician: Dr. Emily Filbert  Reason for visit:  EGD and colonoscopy.  History of present illness:  Patient is a 82 year old female presenting today for EGD and colonoscopy because of unexplained weight loss and anemia. She does take Xarelto but has held that agent since this past Monday. He has had a CT scan of the abdomen pelvis that was uninformative. Does have a history of reflux but takes a proton pump inhibitor daily. He noted some darkening with her stools when she started iron but not before. She does have a pacemaker. Her last colonoscopy was about 20 years ago. There is a family history of rectal cancer in mother. He currently denies dysphagia or regurgitation or heartburn.    Current facility-administered medications:  .  0.9 %  sodium chloride infusion, , Intravenous, Continuous, Elizabeth Sails, MD, Last Rate: 20 mL/hr at 10/19/15 1333, 1,000 mL at 10/19/15 1333 .  0.9 %  sodium chloride infusion, , Intravenous, Continuous, Elizabeth Sails, MD .  ipratropium-albuterol (DUONEB) 0.5-2.5 (3) MG/3ML nebulizer solution 3 mL, 3 mL, Nebulization, Once, Andria Frames, MD  Prescriptions prior to admission  Medication Sig Dispense Refill Last Dose  . levothyroxine (SYNTHROID, LEVOTHROID) 75 MCG tablet Take 1 tablet by mouth daily.  3 10/18/2015 at Unknown time  . metoprolol succinate (TOPROL-XL) 25 MG 24 hr tablet    0900 at Unknown time  . omeprazole (PRILOSEC) 20 MG capsule Take 20 mg by mouth daily.     10/18/2015 at Unknown time  . rOPINIRole (REQUIP) 0.25 MG tablet Take by mouth.   10/18/2015 at Unknown time  . sertraline (ZOLOFT) 50 MG tablet Take 50 mg by mouth daily.     10/18/2015 at Unknown time  . simvastatin (ZOCOR) 40 MG tablet Take 40 mg by mouth at bedtime.     10/18/2015 at Unknown time  . sucralfate (CARAFATE) 1 G tablet    10/18/2015 at Unknown time  . traMADol (ULTRAM) 50 MG  tablet Take 50 mg by mouth every 6 (six) hours as needed.     10/18/2015 at Unknown time  . XARELTO 20 MG TABS tablet Take 1 tablet by mouth daily.   10/14/2015 at 0900     Allergies  Allergen Reactions  . Codeine   . Hydrocodone Nausea Only     Past Medical History  Diagnosis Date  . Previous back surgery   . Pacemaker   . Shingles   . Thyroid disease   . Wears glasses   . Hearing loss   . Arthritis   . Wears dentures   . Hypertension   . 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 Lipoma of thigh 2015    Outer  . Hiatal hernia   . Erosive gastritis   . Esophageal stricture   . CAD (coronary artery disease)   . Cardiac pacemaker   . Hypothyroidism   . Hyperlipidemia   . Aortic regurgitation   . Atrial fibrillation ([place])   . Vocal cord polyp     Review of systems:      Physical Exam    Heart and lungs: Irregular rhythm, clear to auscultation.    HEENT: Normocephalic atraumatic eyes are anicteric    Other:     Pertinant exam for procedure: Soft nontender nondistended bowel sounds positive normoactive.    Planned proceedures: EGD and colonoscopy with indicated procedures. I have discussed the risks benefits  and complications of procedures to include not limited to bleeding, infection, perforation and the risk of sedation and the patient wishes to proceed.    Elizabeth Sails, MD Gastroenterology 10/19/2015  2:43 PM

## 2015-10-22 NOTE — Anesthesia Postprocedure Evaluation (Signed)
Anesthesia Post Note  Patient: Elizabeth Boone  Procedure(s) Performed: Procedure(s) (LRB): COLONOSCOPY WITH PROPOFOL (N/A) ESOPHAGOGASTRODUODENOSCOPY (EGD)  Patient location during evaluation: PACU Anesthesia Type: General Level of consciousness: awake Pain management: pain level controlled Vital Signs Assessment: post-procedure vital signs reviewed and stable Respiratory status: spontaneous breathing Cardiovascular status: blood pressure returned to baseline Postop Assessment: no headache Anesthetic complications: no    Last Vitals:  Filed Vitals:   10/19/15 1619 10/19/15 1629  BP: 129/68 104/90  Pulse: 74 74  Temp:    Resp: 16 12    Last Pain:  Filed Vitals:   10/20/15 1912  PainSc: 0-No pain                 Alvira Hecht M

## 2015-10-24 ENCOUNTER — Encounter: Payer: Self-pay | Admitting: Gastroenterology

## 2015-11-29 DIAGNOSIS — R1312 Dysphagia, oropharyngeal phase: Secondary | ICD-10-CM | POA: Insufficient documentation

## 2015-12-21 ENCOUNTER — Other Ambulatory Visit: Payer: Self-pay | Admitting: Gastroenterology

## 2015-12-21 DIAGNOSIS — K5730 Diverticulosis of large intestine without perforation or abscess without bleeding: Secondary | ICD-10-CM

## 2015-12-21 DIAGNOSIS — D649 Anemia, unspecified: Secondary | ICD-10-CM

## 2015-12-21 DIAGNOSIS — Z86010 Personal history of colonic polyps: Secondary | ICD-10-CM

## 2016-01-24 ENCOUNTER — Ambulatory Visit
Admission: RE | Admit: 2016-01-24 | Discharge: 2016-01-24 | Disposition: A | Discharge status: Home / Self Care | Payer: Medicare Other | Source: Ambulatory Visit | Attending: Gastroenterology | Admitting: Gastroenterology

## 2016-01-24 DIAGNOSIS — Z86010 Personal history of colonic polyps: Secondary | ICD-10-CM

## 2016-01-24 DIAGNOSIS — K5730 Diverticulosis of large intestine without perforation or abscess without bleeding: Secondary | ICD-10-CM

## 2016-01-24 DIAGNOSIS — D649 Anemia, unspecified: Secondary | ICD-10-CM

## 2016-03-13 ENCOUNTER — Encounter: Payer: Self-pay | Admitting: *Deleted

## 2016-03-14 ENCOUNTER — Ambulatory Visit: Payer: Medicare Other | Admitting: Anesthesiology

## 2016-03-14 ENCOUNTER — Encounter
Admission: RE | Disposition: A | Discharge status: Home / Self Care | Payer: Self-pay | Source: Ambulatory Visit | Attending: Unknown Physician Specialty

## 2016-03-14 ENCOUNTER — Encounter: Payer: Self-pay | Admitting: *Deleted

## 2016-03-14 ENCOUNTER — Ambulatory Visit
Admission: RE | Admit: 2016-03-14 | Discharge: 2016-03-14 | Disposition: A | Discharge status: Home / Self Care | Payer: Medicare Other | Source: Ambulatory Visit | Attending: Unknown Physician Specialty | Admitting: Unknown Physician Specialty

## 2016-03-14 DIAGNOSIS — I2510 Atherosclerotic heart disease of native coronary artery without angina pectoris: Secondary | ICD-10-CM | POA: Insufficient documentation

## 2016-03-14 DIAGNOSIS — I10 Essential (primary) hypertension: Secondary | ICD-10-CM | POA: Insufficient documentation

## 2016-03-14 DIAGNOSIS — Z7901 Long term (current) use of anticoagulants: Secondary | ICD-10-CM | POA: Insufficient documentation

## 2016-03-14 DIAGNOSIS — Z950 Presence of cardiac pacemaker: Secondary | ICD-10-CM | POA: Insufficient documentation

## 2016-03-14 DIAGNOSIS — I4891 Unspecified atrial fibrillation: Secondary | ICD-10-CM | POA: Insufficient documentation

## 2016-03-14 DIAGNOSIS — F17200 Nicotine dependence, unspecified, uncomplicated: Secondary | ICD-10-CM | POA: Insufficient documentation

## 2016-03-14 DIAGNOSIS — M1990 Unspecified osteoarthritis, unspecified site: Secondary | ICD-10-CM | POA: Diagnosis not present

## 2016-03-14 DIAGNOSIS — K222 Esophageal obstruction: Secondary | ICD-10-CM | POA: Diagnosis not present

## 2016-03-14 DIAGNOSIS — E039 Hypothyroidism, unspecified: Secondary | ICD-10-CM | POA: Insufficient documentation

## 2016-03-14 DIAGNOSIS — R1310 Dysphagia, unspecified: Secondary | ICD-10-CM | POA: Insufficient documentation

## 2016-03-14 DIAGNOSIS — K449 Diaphragmatic hernia without obstruction or gangrene: Secondary | ICD-10-CM | POA: Diagnosis not present

## 2016-03-14 DIAGNOSIS — E785 Hyperlipidemia, unspecified: Secondary | ICD-10-CM | POA: Diagnosis not present

## 2016-03-14 DIAGNOSIS — Z79899 Other long term (current) drug therapy: Secondary | ICD-10-CM | POA: Diagnosis not present

## 2016-03-14 DIAGNOSIS — I495 Sick sinus syndrome: Secondary | ICD-10-CM | POA: Insufficient documentation

## 2016-03-14 HISTORY — PX: BALLOON DILATION: SHX5330

## 2016-03-14 HISTORY — PX: ESOPHAGOGASTRODUODENOSCOPY (EGD) WITH PROPOFOL: SHX5813

## 2016-03-14 SURGERY — ESOPHAGOGASTRODUODENOSCOPY (EGD) WITH PROPOFOL
Anesthesia: General

## 2016-03-14 MED ORDER — PROPOFOL 500 MG/50ML IV EMUL
INTRAVENOUS | Status: DC | PRN
  Administered 2016-03-14: 75 ug/kg/min via INTRAVENOUS

## 2016-03-14 MED ORDER — SODIUM CHLORIDE 0.9 % IV SOLN
INTRAVENOUS | Status: DC
  Administered 2016-03-14: 08:00:00 via INTRAVENOUS

## 2016-03-14 MED ORDER — FENTANYL CITRATE (PF) 100 MCG/2ML IJ SOLN: 25.0000 ug | INTRAMUSCULAR | Status: DC | PRN

## 2016-03-14 MED ORDER — PHENYLEPHRINE HCL 10 MG/ML IJ SOLN
INTRAMUSCULAR | Status: DC | PRN
  Administered 2016-03-14 (×3): 100 ug via INTRAVENOUS
  Administered 2016-03-14 (×2): 200 ug via INTRAVENOUS

## 2016-03-14 MED ORDER — BUTAMBEN-TETRACAINE-BENZOCAINE 2-2-14 % EX AERO
INHALATION_SPRAY | CUTANEOUS | Status: DC | PRN
  Administered 2016-03-14: 1 via TOPICAL

## 2016-03-14 MED ORDER — ONDANSETRON HCL 4 MG/2ML IJ SOLN: 4.0000 mg | Freq: Once | INTRAMUSCULAR | Status: DC | PRN

## 2016-03-14 MED ORDER — PROPOFOL 10 MG/ML IV BOLUS
INTRAVENOUS | Status: DC | PRN
  Administered 2016-03-14 (×2): 40 mg via INTRAVENOUS
  Administered 2016-03-14: 20 mg via INTRAVENOUS

## 2016-03-14 MED ORDER — SODIUM CHLORIDE 0.9 % IV SOLN: INTRAVENOUS | Status: DC

## 2016-03-14 NOTE — H&P (Signed)
Primary Care Physician:  Rusty Aus, MD Primary Gastroenterologist:  Dr. Vira Agar  Pre-Procedure History & Physical: HPI:  Elizabeth Boone is a 82 y.o. female is here for an endoscopy.   Past Medical History  Diagnosis Date  . Previous back surgery   . Pacemaker   . Shingles   . Thyroid disease   . Wears glasses   . Hearing loss   . Arthritis   . Wears dentures   . Hypertension   . 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 Lipoma of thigh 2015    Outer  . Hiatal hernia   . Erosive gastritis   . Esophageal stricture   . CAD (coronary artery disease)   . Cardiac pacemaker   . Hypothyroidism   . Hyperlipidemia   . Aortic regurgitation   . Atrial fibrillation ([place])   . Vocal cord polyp     Past Surgical History  Procedure Laterality Date  . Abdominal hysterectomy    . Back surgery    . Gallbladder surgery    . Thyroid surgery    . Cholecystectomy    . Cataract extraction    . Colonoscopy    . Esophagogastroduodenoscopy    . Colonoscopy with propofol N/A 10/19/2015    Procedure: COLONOSCOPY WITH PROPOFOL;  Surgeon: Lollie Sails, MD;  Location: [facility] ENDOSCOPY;  Service: Endoscopy;  Laterality: N/A;  . Esophagogastroduodenoscopy  10/19/2015    Procedure: ESOPHAGOGASTRODUODENOSCOPY (EGD);  Surgeon: Lollie Sails, MD;  Location: [facility] ENDOSCOPY;  Service: Endoscopy;;    Prior to Admission medications   Medication Sig Start Date End Date Taking? Authorizing Provider  levothyroxine (SYNTHROID, LEVOTHROID) 75 MCG tablet Take 1 tablet by mouth daily. 07/27/15  Yes Historical Provider, MD  metoprolol succinate (TOPROL-XL) 25 MG 24 hr tablet  07/17/15  Yes Historical Provider, MD  omeprazole (PRILOSEC) 20 MG capsule Take 20 mg by mouth daily.     Yes Historical Provider, MD  rOPINIRole (REQUIP) 0.25 MG tablet Take by mouth. 07/12/15 07/11/16 Yes Historical Provider, MD  sertraline (ZOLOFT) 50 MG tablet Take 50 mg by mouth daily.     Yes Historical Provider, MD  simvastatin  (ZOCOR) 40 MG tablet Take 40 mg by mouth at bedtime.     Yes Historical Provider, MD  sucralfate (CARAFATE) 1 G tablet  07/17/15  Yes Historical Provider, MD  traMADol (ULTRAM) 50 MG tablet Take 50 mg by mouth every 6 (six) hours as needed.      Historical Provider, MD  XARELTO 20 MG TABS tablet Take 1 tablet by mouth daily. 07/25/15   Historical Provider, MD    Allergies as of 03/11/2016 - Review Complete 10/19/2015  Allergen Reaction Noted  . Codeine  01/29/2011  . Hydrocodone Nausea Only 07/19/2015    History reviewed. No pertinent family history.  Social History   Social History  . Marital Status: Married    Spouse Name: N/A  . Number of Children: N/A  . Years of Education: N/A   Occupational History  . Not on file.   Social History Main Topics  . Smoking status: Current Every Day Smoker -- 1.00 packs/day  . Smokeless tobacco: Never Used  . Alcohol Use: No  . Drug Use: No  . Sexual Activity: Not on file   Other Topics Concern  . Not on file   Social History Narrative    Review of Systems: See HPI, otherwise negative ROS  Physical Exam: BP 108/57 mmHg  Pulse 86  Temp(Src) 96.7 F (35.9 C) (Tympanic)  Resp 15  Ht 5\' 9"  (1.753 m)  Wt 68.04 kg (150 lb)  BMI 22.14 kg/m2  SpO2 99% General:   Alert,  pleasant and cooperative in NAD Head:  Normocephalic and atraumatic. Neck:  Supple; no masses or thyromegaly. Lungs:  Clear throughout to auscultation.    Heart:  Regular rate and rhythem Abdomen:  Soft, nontender and nondistended. Normal bowel sounds, without guarding, and without rebound.   Neurologic:  Alert and  oriented x4;  grossly normal neurologically.  Impression/Plan: Elizabeth Boone is here for an endoscopy to be performed for dysphagia  Risks, benefits, limitations, and alternatives regarding  endoscopy have been reviewed with the patient.  Questions have been answered.  All parties agreeable.   Gaylyn Cheers, MD  03/14/2016, 9:11 AM

## 2016-03-14 NOTE — Op Note (Signed)
[facility] Patient Name: Elizabeth Boone Procedure Date: 03/14/2016 9:11 AM MRN: [id number] Account #: [id number] Date of Birth: 1932/03/03 Admit Type: Outpatient Age: 82 Room: [facility] ENDO ROOM 1 Gender: Female Note Status: Finalized Procedure:            Upper GI endoscopy Indications:          Dysphagia Providers:            Manya Silvas, MD Referring MD:         Rusty Aus, MD (Referring MD) Medicines:            Propofol per Anesthesia Complications:        No immediate complications. Procedure:            Pre-Anesthesia Assessment:                       - After reviewing the risks and benefits, the patient                        was deemed in satisfactory condition to undergo the                        procedure.                       After obtaining informed consent, the endoscope was                        passed under direct vision. Throughout the procedure,                        the patient's blood pressure, pulse, and oxygen                        saturations were monitored continuously. The Endoscope                        was introduced through the mouth, and advanced to the                        duodenal bulb. The upper GI endoscopy was accomplished                        without difficulty. The patient tolerated the procedure                        well. Findings:      One severe extrinsic stenosis was found. This was at the cricopharyngeal       area. Due to this we switched scopes to the ENT scope and inserted this       one and the area of narrowing was traversed. After exam was done A       guidewire was placed and the scope was withdrawn. Dilation was performed       with a Savary dilator with mild resistance at 11 mm, 12 mm, 12.8 mm, 14       mm and 15 mm.      The stomach was normal. Impression:           - Extrinsic narrowing of the esophagus. Dilated.                       -  Normal stomach.  - Normal examined duodenum.                       - No specimens collected. Recommendation:       - soft food for 3 days, eat slowly, chew well, take                        small bites, ultra soft diet, try pudding type food                        first. Manya Silvas, MD 03/14/2016 9:41:54 AM This report has been signed electronically. Number of Addenda: 0 Note Initiated On: 03/14/2016 9:11 AM      [facility]

## 2016-03-14 NOTE — Anesthesia Preprocedure Evaluation (Addendum)
Anesthesia Evaluation  Patient identified by MRN, date of birth, ID band Patient awake    Reviewed: Allergy & Precautions, NPO status , Patient's Chart, lab work & pertinent test results, reviewed documented beta blocker date and time   Airway Mallampati: II  TM Distance: >3 FB Neck ROM: Limited    Dental  (+) Edentulous Upper   Pulmonary Current Smoker,    Pulmonary exam normal        Cardiovascular Exercise Tolerance: Poor hypertension, Pt. on medications and Pt. on home beta blockers + CAD  + dysrhythmias Atrial Fibrillation + pacemaker  Rhythm:Irregular Rate:Normal     Neuro/Psych negative neurological ROS  negative psych ROS   GI/Hepatic hiatal hernia, PUD,   Endo/Other  Hypothyroidism Treated.  Renal/GU      Musculoskeletal  (+) Arthritis , Osteoarthritis,    Abdominal (+)  Abdomen: soft.    Peds  Hematology   Anesthesia Other Findings Pacemaker for sick sinus syndrome ( [employer] )  Reproductive/Obstetrics                           Anesthesia Physical Anesthesia Plan  ASA: III  Anesthesia Plan: General   Post-op Pain Management:    Induction: Intravenous  Airway Management Planned: Nasal Cannula  Additional Equipment:   Intra-op Plan:   Post-operative Plan:   Informed Consent: I have reviewed the patients History and Physical, chart, labs and discussed the procedure including the risks, benefits and alternatives for the proposed anesthesia with the patient or authorized representative who has indicated his/her understanding and acceptance.   Dental advisory given  Plan Discussed with: Surgeon and CRNA  Anesthesia Plan Comments:         Anesthesia Quick Evaluation

## 2016-03-14 NOTE — Transfer of Care (Signed)
Immediate Anesthesia Transfer of Care Note  Patient: Elizabeth Boone  Procedure(s) Performed: Procedure(s): ESOPHAGOGASTRODUODENOSCOPY (EGD) WITH PROPOFOL (N/A) BALLOON DILATION (N/A)  Patient Location: PACU  Anesthesia Type:General  Level of Consciousness: sedated  Airway & Oxygen Therapy: Patient Spontanous Breathing and Patient connected to nasal cannula oxygen  Post-op Assessment: Report given to RN and Post -op Vital signs reviewed and stable  Post vital signs: Reviewed and stable  Last Vitals:  Filed Vitals:   03/14/16 0752 03/14/16 0939  BP: 108/57 90/56  Pulse: 86 74  Temp: 35.9 C   Resp: 15 18    Last Pain: There were no vitals filed for this visit.       Complications: No apparent anesthesia complications

## 2016-03-14 NOTE — Anesthesia Postprocedure Evaluation (Signed)
Anesthesia Post Note  Patient: ANIESA BUDAY  Procedure(s) Performed: Procedure(s) (LRB): ESOPHAGOGASTRODUODENOSCOPY (EGD) WITH PROPOFOL (N/A) BALLOON DILATION (N/A)  Patient location during evaluation: PACU Anesthesia Type: General Level of consciousness: awake and alert and oriented Pain management: pain level controlled Vital Signs Assessment: post-procedure vital signs reviewed and stable Respiratory status: spontaneous breathing Cardiovascular status: blood pressure returned to baseline Anesthetic complications: no    Last Vitals:  Filed Vitals:   03/14/16 1010 03/14/16 1020  BP:  135/80  Pulse: 73 77  Temp:    Resp: 13 15    Last Pain: There were no vitals filed for this visit.               Crucita Lacorte

## 2016-05-12 ENCOUNTER — Ambulatory Visit
Admission: EM | Admit: 2016-05-12 | Discharge: 2016-05-12 | Disposition: A | Discharge status: Home / Self Care | Payer: Medicare Other | Attending: Family Medicine | Admitting: Family Medicine

## 2016-05-12 ENCOUNTER — Encounter: Payer: Self-pay | Admitting: Emergency Medicine

## 2016-05-12 DIAGNOSIS — L292 Pruritus vulvae: Secondary | ICD-10-CM | POA: Insufficient documentation

## 2016-05-12 DIAGNOSIS — Z79899 Other long term (current) drug therapy: Secondary | ICD-10-CM | POA: Insufficient documentation

## 2016-05-12 DIAGNOSIS — Z950 Presence of cardiac pacemaker: Secondary | ICD-10-CM | POA: Diagnosis not present

## 2016-05-12 DIAGNOSIS — E039 Hypothyroidism, unspecified: Secondary | ICD-10-CM | POA: Insufficient documentation

## 2016-05-12 DIAGNOSIS — I4891 Unspecified atrial fibrillation: Secondary | ICD-10-CM | POA: Insufficient documentation

## 2016-05-12 DIAGNOSIS — K449 Diaphragmatic hernia without obstruction or gangrene: Secondary | ICD-10-CM | POA: Diagnosis not present

## 2016-05-12 DIAGNOSIS — I10 Essential (primary) hypertension: Secondary | ICD-10-CM | POA: Insufficient documentation

## 2016-05-12 DIAGNOSIS — Z7901 Long term (current) use of anticoagulants: Secondary | ICD-10-CM | POA: Diagnosis not present

## 2016-05-12 DIAGNOSIS — M1990 Unspecified osteoarthritis, unspecified site: Secondary | ICD-10-CM | POA: Insufficient documentation

## 2016-05-12 DIAGNOSIS — L298 Other pruritus: Secondary | ICD-10-CM

## 2016-05-12 DIAGNOSIS — E785 Hyperlipidemia, unspecified: Secondary | ICD-10-CM | POA: Insufficient documentation

## 2016-05-12 DIAGNOSIS — I2510 Atherosclerotic heart disease of native coronary artery without angina pectoris: Secondary | ICD-10-CM | POA: Insufficient documentation

## 2016-05-12 DIAGNOSIS — N898 Other specified noninflammatory disorders of vagina: Secondary | ICD-10-CM

## 2016-05-12 LAB — URINALYSIS COMPLETE WITH MICROSCOPIC (ARMC ONLY)
BACTERIA UA: NONE SEEN
Bilirubin Urine: NEGATIVE
GLUCOSE, UA: NEGATIVE mg/dL
HGB URINE DIPSTICK: NEGATIVE
Ketones, ur: NEGATIVE mg/dL
NITRITE: NEGATIVE
PH: 5.5 (ref 5.0–8.0)
PROTEIN: NEGATIVE mg/dL
Specific Gravity, Urine: 1.01 (ref 1.005–1.030)

## 2016-05-12 LAB — WET PREP, GENITAL
Clue Cells Wet Prep HPF POC: NONE SEEN
SPERM: NONE SEEN
TRICH WET PREP: NONE SEEN
YEAST WET PREP: NONE SEEN

## 2016-05-12 NOTE — ED Provider Notes (Signed)
[facility]    CSN: [id number] Arrival date & time: 05/12/16  [id number]  First Provider Contact:  First MD Initiated Contact with Patient 05/12/16 1007        History   Chief Complaint Chief Complaint  Patient presents with  . Vaginal Itching    HPI Elizabeth Boone is a 82 y.o. female.   HPI: Patient presents today with symptoms of persistent vaginal itchiness. Patient states that she's had the symptoms for several months. She has been given steroid cream and Premarin in the past. Patient admits to having had a hysterectomy in the past. She has seen her gynecologist in the past and a dermatologist in the past for her symptoms. She states that she does have some burning with urination and wonders whether her current symptoms are related to UTI. She has not had a biopsy of the area. Denies any significant vaginal discharge. Denies any STD history or risk.   Past Medical History:  Diagnosis Date  . Aortic regurgitation   . Arthritis   . Atrial fibrillation ([place])   . CAD (coronary artery disease)   . 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 Cardiac pacemaker   . Erosive gastritis   . Esophageal stricture   . Hearing loss   . Hiatal hernia   . Hyperlipidemia   . Hypertension   . Hypothyroidism   . Lipoma of thigh 2015   Outer  . Pacemaker   . Previous back surgery   . Shingles   . Thyroid disease   . Vocal cord polyp   . Wears dentures   . Wears glasses     Patient Active Problem List   Diagnosis Date Noted  . Soft tissue mass 09/18/2015  . BP (high blood pressure) 09/13/2015  . Adult hypothyroidism 09/13/2015  . Sick sinus syndrome ([place]) 09/13/2015  . Combined fat and carbohydrate induced hyperlipemia 02/27/2015  . H/O malignant neoplasm of head and neck 02/08/2015  . Atrial fibrillation ([place]) 10/30/2011    Past Surgical History:  Procedure Laterality Date  . ABDOMINAL HYSTERECTOMY    . BACK SURGERY    . BALLOON DILATION N/A 03/14/2016   Procedure: BALLOON  DILATION;  Surgeon: Manya Silvas, MD;  Location: [facility] ENDOSCOPY;  Service: Endoscopy;  Laterality: N/A;  . CATARACT EXTRACTION    . CHOLECYSTECTOMY    . COLONOSCOPY    . COLONOSCOPY WITH PROPOFOL N/A 10/19/2015   Procedure: COLONOSCOPY WITH PROPOFOL;  Surgeon: Lollie Sails, MD;  Location: [facility] ENDOSCOPY;  Service: Endoscopy;  Laterality: N/A;  . ESOPHAGOGASTRODUODENOSCOPY    . ESOPHAGOGASTRODUODENOSCOPY  10/19/2015   Procedure: ESOPHAGOGASTRODUODENOSCOPY (EGD);  Surgeon: Lollie Sails, MD;  Location: [facility] ENDOSCOPY;  Service: Endoscopy;;  . ESOPHAGOGASTRODUODENOSCOPY (EGD) WITH PROPOFOL N/A 03/14/2016   Procedure: ESOPHAGOGASTRODUODENOSCOPY (EGD) WITH PROPOFOL;  Surgeon: Manya Silvas, MD;  Location: [facility] ENDOSCOPY;  Service: Endoscopy;  Laterality: N/A;  . GALLBLADDER SURGERY    . THYROID SURGERY      OB History    No data available       Home Medications    Prior to Admission medications   Medication Sig Start Date End Date Taking? Authorizing Provider  clobetasol ointment (TEMOVATE) [id number] % Apply 1 application topically 2 (two) times daily.   Yes Historical Provider, MD  conjugated estrogens (PREMARIN) vaginal cream Place 1 Applicatorful vaginally 2 (two) times a week.   Yes Historical Provider, MD  levothyroxine (SYNTHROID, LEVOTHROID) 75 MCG tablet Take 1 tablet by mouth  daily. 07/27/15   Historical Provider, MD  metoprolol succinate (TOPROL-XL) 25 MG 24 hr tablet  07/17/15   Historical Provider, MD  omeprazole (PRILOSEC) 20 MG capsule Take 20 mg by mouth daily.      Historical Provider, MD  rOPINIRole (REQUIP) 0.25 MG tablet Take by mouth. 07/12/15 07/11/16  Historical Provider, MD  sertraline (ZOLOFT) 50 MG tablet Take 50 mg by mouth daily.      Historical Provider, MD  simvastatin (ZOCOR) 40 MG tablet Take 40 mg by mouth at bedtime.      Historical Provider, MD  sucralfate (CARAFATE) 1 G tablet  07/17/15   Historical Provider, MD  traMADol (ULTRAM) 50 MG tablet  Take 50 mg by mouth every 6 (six) hours as needed.      Historical Provider, MD  XARELTO 20 MG TABS tablet Take 1 tablet by mouth daily. 07/25/15   Historical Provider, MD    Family History History reviewed. No pertinent family history.  Social History Social History  Substance Use Topics  . Smoking status: Current Every Day Smoker    Packs/day: 1.00  . Smokeless tobacco: Never Used  . Alcohol use No     Allergies   Codeine and Hydrocodone   Review of Systems Review of Systems: Negative except mentioned above.   Physical Exam Triage Vital Signs ED Triage Vitals  Enc Vitals Group     BP 05/12/16 0959 139/70     Pulse Rate 05/12/16 0959 80     Resp 05/12/16 0959 16     Temp 05/12/16 0959 98 F (36.7 C)     Temp Source 05/12/16 0959 Oral     SpO2 05/12/16 0959 100 %     Weight --      Height --      Head Circumference --      Peak Flow --      Pain Score 05/12/16 1003 4     Pain Loc --      Pain Edu? --      Excl. in [place]? --    No data found.   Updated Vital Signs BP 139/70 (BP Location: Left Arm)   Pulse 80   Temp 98 F (36.7 C) (Oral)   Resp 16   SpO2 100%      Physical Exam:  GENERAL: NAD RESP: CTA B CARD: RRR GU: erythema in the vaginal area, no genital lesions or bleeding appreciated, no discharge appreciated visually, full pelvic exam was not done    UC Treatments / Results  Labs (all labs ordered are listed, but only abnormal results are displayed) Labs Reviewed  WET PREP, GENITAL  URINALYSIS COMPLETEWITH MICROSCOPIC ([place])    EKG  EKG Interpretation None       Radiology No results found.  Procedures Procedures (including critical care time)  Medications Ordered in UC Medications - No data to display   Initial Impression / Assessment and Plan / UC Course  I have reviewed the triage vital signs and the nursing notes.  Pertinent labs & imaging results that were available during my care of the patient were reviewed by  me and considered in my medical decision making (see chart for details).  Clinical Course  A/P: Vaginal itching - given the chronicity of her symptoms I would recommend that patient follow-up with GYN, wet prep and UA were not conclusive. Would recommend patient just use Vaseline or Aquaphor on the area for now and see GYN as soon as possible. Patient addresses  understanding.  Final Clinical Impressions(s) / UC Diagnoses   Final diagnoses:  None    New Prescriptions New Prescriptions   No medications on file     Paulina Fusi, MD 05/12/16 1059

## 2016-05-12 NOTE — Discharge Instructions (Signed)
Patient to follow-up with GYN in the near future. Can try using Aquaphor Vaseline on the area for now until seen by GYN.

## 2016-05-12 NOTE — ED Triage Notes (Signed)
Patient c/o ongoing vaginal itching and irritation for several years but reports yesterday was the worse it has ever been.  Patient denies vaginal discharge. Patient has been using Premarin vaginal cream and Clobetasol Ointment.

## 2016-07-11 ENCOUNTER — Encounter: Payer: Self-pay | Admitting: *Deleted

## 2016-08-01 ENCOUNTER — Ambulatory Visit: Payer: Medicare Other | Admitting: Radiation Oncology

## 2016-08-14 ENCOUNTER — Ambulatory Visit
Admission: RE | Admit: 2016-08-14 | Discharge: 2016-08-14 | Disposition: A | Discharge status: Home / Self Care | Payer: Medicare Other | Source: Ambulatory Visit | Attending: Radiation Oncology | Admitting: Radiation Oncology

## 2016-08-14 ENCOUNTER — Encounter: Payer: Self-pay | Admitting: Radiation Oncology

## 2016-08-14 ENCOUNTER — Other Ambulatory Visit: Payer: Self-pay | Admitting: *Deleted

## 2016-08-14 VITALS — BP 129/82 | HR 83 | Temp 96.4°F | Resp 20 | Wt 155.4 lb

## 2016-08-14 DIAGNOSIS — R918 Other nonspecific abnormal finding of lung field: Secondary | ICD-10-CM | POA: Insufficient documentation

## 2016-08-14 DIAGNOSIS — R05 Cough: Secondary | ICD-10-CM | POA: Diagnosis not present

## 2016-08-14 DIAGNOSIS — Z923 Personal history of irradiation: Secondary | ICD-10-CM | POA: Diagnosis not present

## 2016-08-14 DIAGNOSIS — R059 Cough, unspecified: Secondary | ICD-10-CM

## 2016-08-14 DIAGNOSIS — C329 Malignant neoplasm of larynx, unspecified: Secondary | ICD-10-CM

## 2016-08-14 DIAGNOSIS — Z8521 Personal history of malignant neoplasm of larynx: Secondary | ICD-10-CM | POA: Diagnosis not present

## 2016-08-14 MED ORDER — AZITHROMYCIN 250 MG PO TABS: ORAL_TABLET | ORAL | 0 refills | Status: DC

## 2016-08-14 NOTE — Progress Notes (Signed)
Radiation Oncology Follow up Note  Name: Elizabeth Boone   Date:   08/14/2016 MRN:  [id number] DOB: 1932/03/06    This 81 y.o. female presents to the clinic today for for a half year follow-up status post. Radiation therapy for laryngeal cancer.  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 Seen today in routine follow-up she is doing well she's developed a cough and a lot of mucus production. Her voice tone is good she specifically denies head and neck pain or dysphagia..  COMPLICATIONS OF TREATMENT: none  FOLLOW UP COMPLIANCE: keeps appointments   PHYSICAL EXAM:  BP 129/82   Pulse 83   Temp (!) 96.4 F (35.8 C)   Resp 20   Wt 155 lb 6.8 oz (70.5 kg)   BMI 22.95 kg/m  Indirect mirror examination shows upper airway clear cord approximately well vallecula and base of tongue within normal limits no evidence of disease. Oral cavity is clear. Neck is clear without evidence of subject gastric cervical or supraclavicular adenopathy. She has some minor expiratory wheezes in her chest. Well-developed well-nourished patient in NAD. HEENT reveals PERLA, EOMI, discs not visualized.  Oral cavity is clear. No oral mucosal lesions are identified. Neck is clear without evidence of cervical or supraclavicular adenopathy. Lungs are clear to A&P. Cardiac examination is essentially unremarkable with regular rate and rhythm without murmur rub or thrill. Abdomen is benign with no organomegaly or masses noted. Motor sensory and DTR levels are equal and symmetric in the upper and lower extremities. Cranial nerves II through XII are grossly intact. Proprioception is intact. No peripheral adenopathy or edema is identified. No motor or sensory levels are noted. Crude visual fields are within normal range.  RADIOLOGY RESULTS: No current films for review I ordered a chest  x-ray based on her cough  PLAN: Present time from her larynx standpoint she is doing well with no evidence of disease now one half years out. I'm going to discontinue follow-up care for that. She continues close follow-up care with ENT. I've ordered a chest x-ray today as well as I've put her on a Z-Pak for her cough. If that worsens she is to see her PMD.  I would like to take this opportunity to thank you for allowing me to participate in the care of your patient.Armstead Peaks., MD

## 2016-10-19 IMAGING — CT CT VIRTUAL COLONOSCOPY DIAGNOSTIC
4 of 9 series · 12 of 36 positions shown, 18 images · non-contrast
Comparison: CT abdomen pelvis dated 07/24/2015

CLINICAL DATA: Incomplete optical colonoscopy. Weight loss, anemia,
constipation. Prior cholecystectomy, appendectomy, and hysterectomy
with bilateral salpingo-oophorectomy.

EXAM:
CT VIRTUAL COLONOSCOPY DIAGNOSTIC
TECHNIQUE: The patient was given a standard Mag citrate bowel preparation with
Gastrografin and barium for fluid and stool tagging respectively.
The quality of the bowel preparation is moderate. Automated CO2
insufflation of the colon was performed prior to image acquisition
and colonic distention is excellent. Image post processing was used
to generate a 3D endoluminal fly-through projection of the colon and
to electronically subtract stool/fluid as appropriate.

[Series 2: supine (id) · axial · 0.78mm/px · z∈[-334,-49]mm · 4 of 380 slices shown]
[im 76/380  soft-tissue]
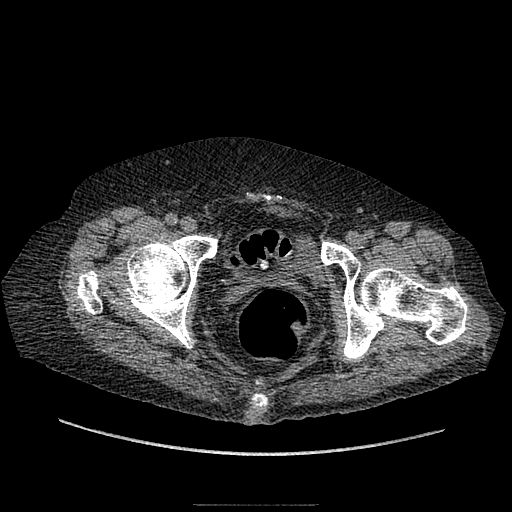
[im 152/380  soft-tissue]
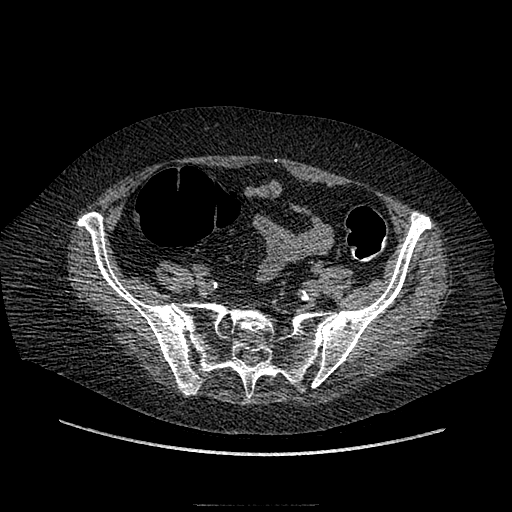
[im 228/380  soft-tissue]
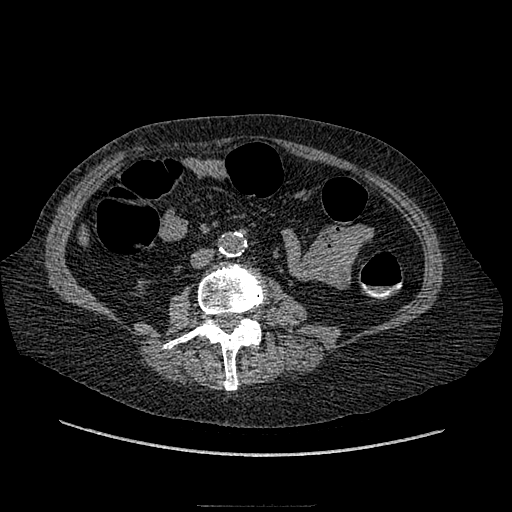
[im 304/380  soft-tissue]
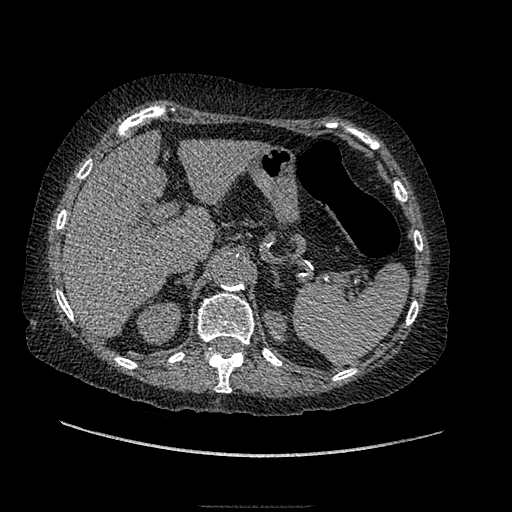

[Series 6: prone (id) · axial · 0.70mm/px · z∈[-386,-42]mm · 5 of 413 slices shown, 10 images]
[im 69/413  soft-tissue]
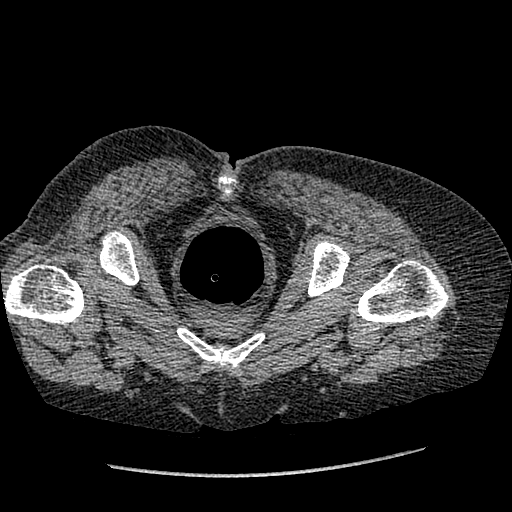
[im 69/413  bone]
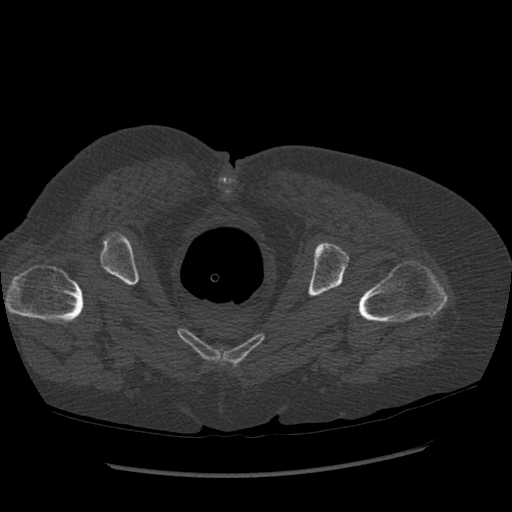
[im 138/413  soft-tissue]
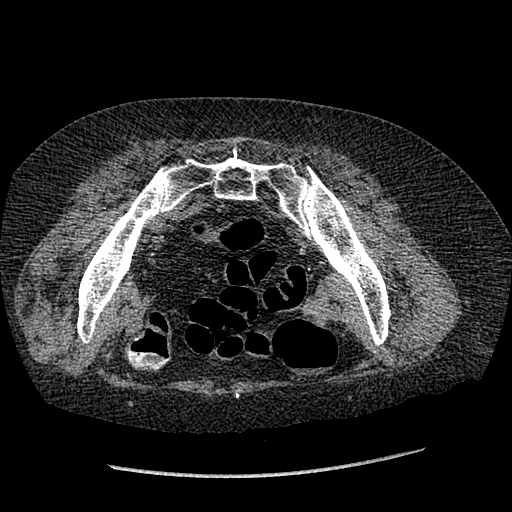
[im 138/413  lung]
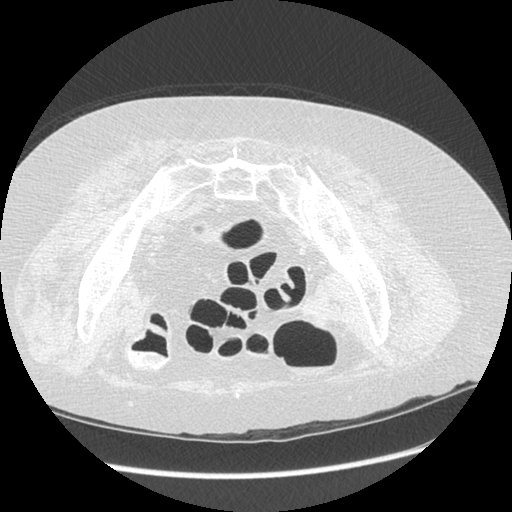
[im 207/413  soft-tissue]
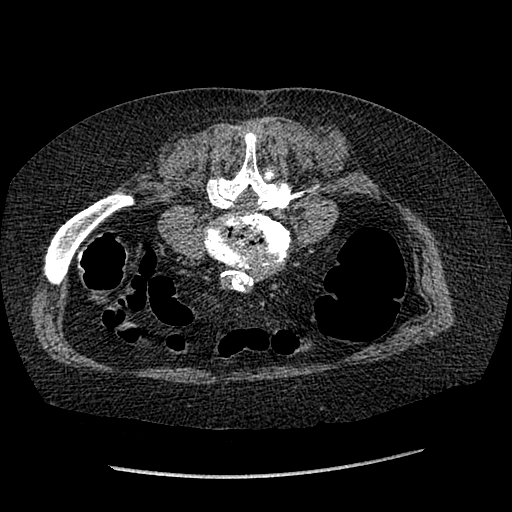
[im 207/413  lung]
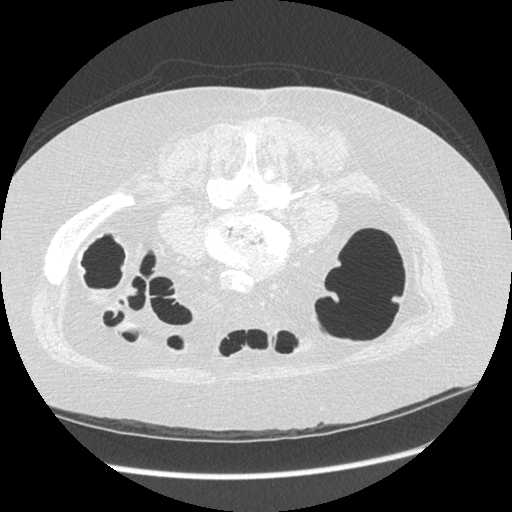
[im 275/413  soft-tissue]
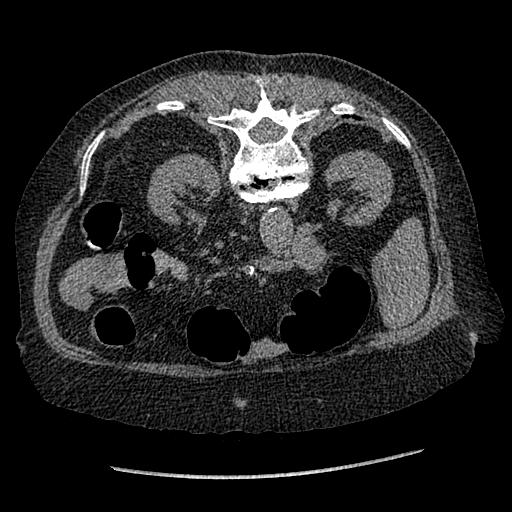
[im 275/413  lung]
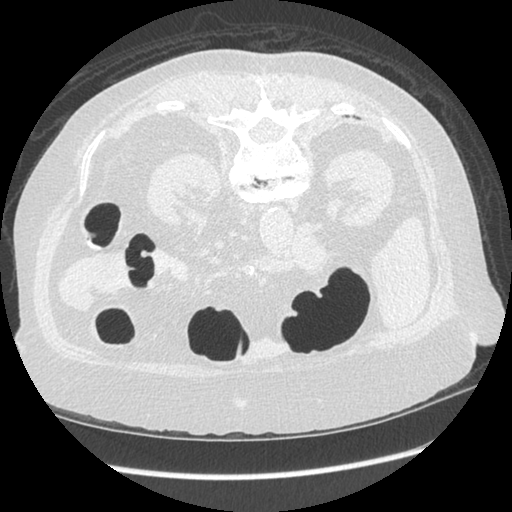
[im 344/413  soft-tissue]
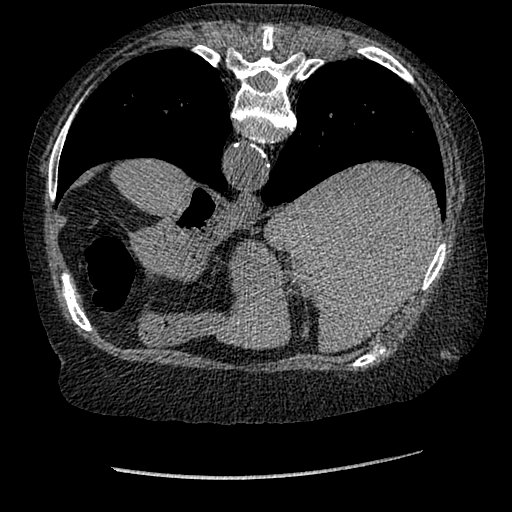
[im 344/413  lung]
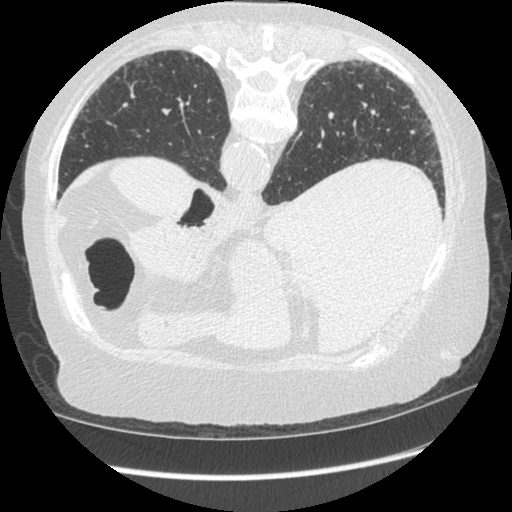

[Series 9: right decub 1.25 · axial · 0.70mm/px · z∈[-365,-266]mm · 2 of 398 slices shown]
[im 80/398  soft-tissue]
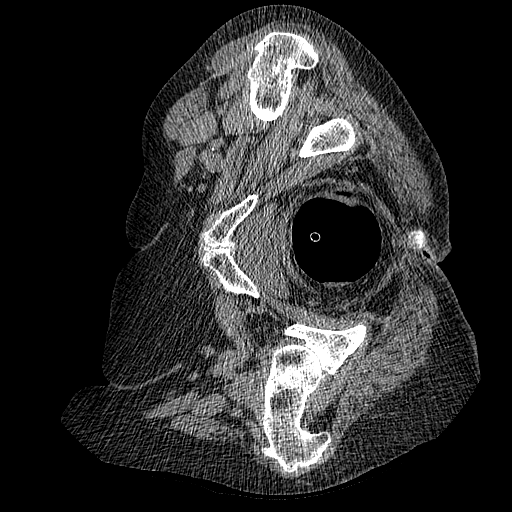
[im 159/398  soft-tissue]
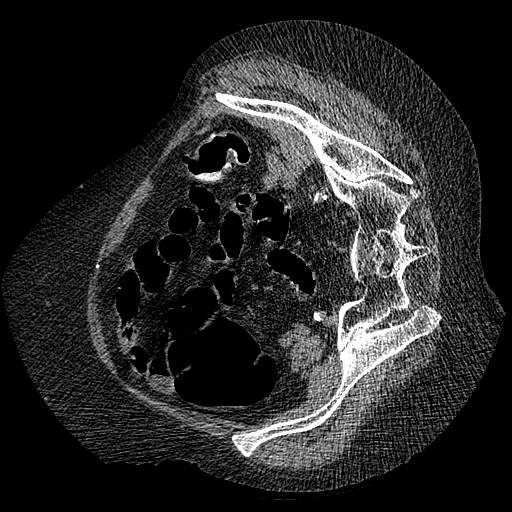

[Series 601: coronal body · coronal · 0.93mm/px · 1 of 123 slices shown, 2 images]
[im 41/123  soft-tissue]
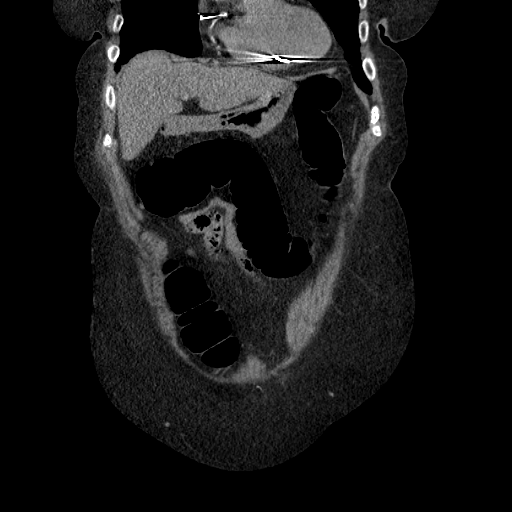
[im 41/123  bone]
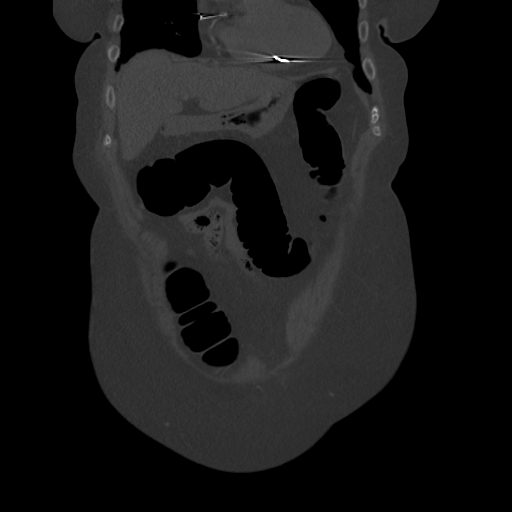

[12 of 36 positions shown; findings below may reference images not displayed]

FINDINGS: VIRTUAL COLONOSCOPY

No significant colonic polyp, mass, apple core lesion, or stricture.

Scattered colonic diverticulosis, without evidence of
diverticulitis.

Lipomatous hypertrophy of the ileocecal valve (series 3/ image 125).

No evidence of bowel obstruction.

Prior appendectomy.

Virtual colonoscopy is not designed to detect diminutive polyps
(i.e., less than or equal to 5 mm), the presence or absence of which
may not affect clinical management.

CT ABDOMEN AND PELVIS WITHOUT CONTRAST

Lung bases are essentially clear. Coronary atherosclerosis.
Pacemaker leads, incompletely visualized.

Mild hepatic steatosis. Status post cholecystectomy. No intrahepatic
or extrahepatic ductal dilatation. Pancreas and adrenal glands are
unremarkable. Calcified splenic granuloma.

Kidneys are unremarkable. No renal, ureteral, or bladder calculi. No
hydronephrosis. Bladder is underdistended but unremarkable.

Atherosclerotic calcifications the abdominal aorta and branch
vessels. No abdominopelvic ascites. No suspicious abdominopelvic
lymphadenopathy.

Status post hysterectomy and bilateral salpingo-oophorectomy.
Postsurgical changes in the lower anterior abdominal wall.

Degenerative changes of the visualized thoracolumbar spine.
IMPRESSION: No significant colonic polypoid lesion, mass, or stricture.

Unremarkable unenhanced CT abdomen/pelvis.

## 2016-12-03 ENCOUNTER — Other Ambulatory Visit: Payer: Self-pay | Admitting: Obstetrics and Gynecology

## 2016-12-03 DIAGNOSIS — N761 Subacute and chronic vaginitis: Secondary | ICD-10-CM

## 2016-12-03 MED ORDER — CLOTRIMAZOLE-BETAMETHASONE 1-0.05 % EX CREA: TOPICAL_CREAM | CUTANEOUS | 0 refills | Status: DC

## 2017-03-04 ENCOUNTER — Other Ambulatory Visit: Payer: Self-pay | Admitting: Unknown Physician Specialty

## 2017-03-04 ENCOUNTER — Ambulatory Visit
Admission: RE | Admit: 2017-03-04 | Discharge: 2017-03-04 | Disposition: A | Discharge status: Home / Self Care | Payer: Medicare Other | Source: Ambulatory Visit | Attending: Unknown Physician Specialty | Admitting: Unknown Physician Specialty

## 2017-03-04 DIAGNOSIS — R059 Cough, unspecified: Secondary | ICD-10-CM

## 2017-03-04 DIAGNOSIS — I700 Atherosclerosis of aorta: Secondary | ICD-10-CM | POA: Diagnosis not present

## 2017-03-04 DIAGNOSIS — R05 Cough: Secondary | ICD-10-CM | POA: Diagnosis present

## 2017-03-04 DIAGNOSIS — J449 Chronic obstructive pulmonary disease, unspecified: Secondary | ICD-10-CM | POA: Insufficient documentation

## 2017-03-04 DIAGNOSIS — F17200 Nicotine dependence, unspecified, uncomplicated: Secondary | ICD-10-CM | POA: Insufficient documentation

## 2017-03-27 ENCOUNTER — Other Ambulatory Visit: Payer: Self-pay | Admitting: Obstetrics and Gynecology

## 2017-03-27 DIAGNOSIS — N761 Subacute and chronic vaginitis: Secondary | ICD-10-CM

## 2017-04-09 DIAGNOSIS — I10 Essential (primary) hypertension: Secondary | ICD-10-CM | POA: Insufficient documentation

## 2017-04-09 DIAGNOSIS — J431 Panlobular emphysema: Secondary | ICD-10-CM | POA: Insufficient documentation

## 2017-04-15 DIAGNOSIS — Z950 Presence of cardiac pacemaker: Secondary | ICD-10-CM | POA: Insufficient documentation

## 2017-06-04 DIAGNOSIS — I4729 Other ventricular tachycardia: Secondary | ICD-10-CM | POA: Insufficient documentation

## 2017-09-05 ENCOUNTER — Other Ambulatory Visit: Payer: Self-pay | Admitting: Obstetrics and Gynecology

## 2017-09-05 DIAGNOSIS — N761 Subacute and chronic vaginitis: Secondary | ICD-10-CM

## 2017-10-21 ENCOUNTER — Other Ambulatory Visit: Payer: Self-pay | Admitting: Neurology

## 2017-10-21 DIAGNOSIS — G44221 Chronic tension-type headache, intractable: Secondary | ICD-10-CM

## 2017-10-30 ENCOUNTER — Ambulatory Visit
Admission: RE | Admit: 2017-10-30 | Discharge: 2017-10-30 | Disposition: A | Discharge status: Home / Self Care | Payer: Medicare Other | Source: Ambulatory Visit | Attending: Neurology | Admitting: Neurology

## 2017-10-30 DIAGNOSIS — I672 Cerebral atherosclerosis: Secondary | ICD-10-CM | POA: Diagnosis not present

## 2017-10-30 DIAGNOSIS — G319 Degenerative disease of nervous system, unspecified: Secondary | ICD-10-CM | POA: Insufficient documentation

## 2017-10-30 DIAGNOSIS — G44221 Chronic tension-type headache, intractable: Secondary | ICD-10-CM | POA: Insufficient documentation

## 2017-10-30 DIAGNOSIS — I6782 Cerebral ischemia: Secondary | ICD-10-CM | POA: Insufficient documentation

## 2017-12-18 ENCOUNTER — Other Ambulatory Visit: Payer: Self-pay

## 2017-12-18 ENCOUNTER — Ambulatory Visit
Admission: EM | Admit: 2017-12-18 | Discharge: 2017-12-18 | Disposition: A | Discharge status: Home / Self Care | Payer: Medicare Other | Attending: Registered Nurse | Admitting: Registered Nurse

## 2017-12-18 ENCOUNTER — Encounter: Payer: Self-pay | Admitting: *Deleted

## 2017-12-18 DIAGNOSIS — L03211 Cellulitis of face: Secondary | ICD-10-CM

## 2017-12-18 MED ORDER — CEPHALEXIN 500 MG PO CAPS: 500.0000 mg | ORAL_CAPSULE | Freq: Two times a day (BID) | ORAL | 0 refills | Status: AC

## 2017-12-18 NOTE — Discharge Instructions (Signed)
May apply clobetasol ointment once a day to left earlobe x 7 days or hydrocortisone 1% OTC twice a day for 7 days to earlobe Ice as needed up to 5 minutes Apply lotion to dry skin twice a day Do not itch or rub affected area

## 2017-12-18 NOTE — ED Triage Notes (Signed)
Patient started having left ear lobe skin irritation and severe itching behind the left ear yesterday. OTC medication have not resolved the symptoms.

## 2017-12-18 NOTE — ED Provider Notes (Signed)
[facility]    CSN: [id number] Arrival date & time: 12/18/17  1314     History   Chief Complaint Chief Complaint  Patient presents with  . Otalgia    HPI Elizabeth Boone is a 84 y.o. female.   84y/o caucasian female established patient here for itchy, painful left earlobe.  I thought at first I had an infection from putting a pierced earring in earlier this week I used hydrogen peroxide and swelling went down but very itchy and tender wondering if something bit me now.  I came in to day because I couldn't stand the itching any more.  I applied some neosporin and benadryl cream last night but it didn't really help.  Denied chills/discharge/fever/changes in hearing.     Past Medical History:  Diagnosis Date  . Aortic regurgitation   . Arthritis   . Atrial fibrillation ([place])   . CAD (coronary artery disease)   . 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 Cardiac pacemaker   . Erosive gastritis   . Esophageal stricture   . Hearing loss   . Hiatal hernia   . Hyperlipidemia   . Hypertension   . Hypothyroidism   . Lipoma of thigh 2015   Outer  . Pacemaker   . Previous back surgery   . Shingles   . Thyroid disease   . Vocal cord polyp   . Wears dentures   . Wears glasses     Patient Active Problem List   Diagnosis Date Noted  . Soft tissue mass 09/18/2015  . BP (high blood pressure) 09/13/2015  . Adult hypothyroidism 09/13/2015  . Sick sinus syndrome ([place]) 09/13/2015  . Combined fat and carbohydrate induced hyperlipemia 02/27/2015  . H/O malignant neoplasm of head and neck 02/08/2015  . Atrial fibrillation ([place]) 10/30/2011    Past Surgical History:  Procedure Laterality Date  . ABDOMINAL HYSTERECTOMY    . BACK SURGERY    . BALLOON DILATION N/A 03/14/2016   Procedure: BALLOON DILATION;  Surgeon: Manya Silvas, MD;  Location: [facility] ENDOSCOPY;  Service: Endoscopy;  Laterality: N/A;  . CATARACT EXTRACTION    . CHOLECYSTECTOMY    . COLONOSCOPY    .  COLONOSCOPY WITH PROPOFOL N/A 10/19/2015   Procedure: COLONOSCOPY WITH PROPOFOL;  Surgeon: Lollie Sails, MD;  Location: [facility] ENDOSCOPY;  Service: Endoscopy;  Laterality: N/A;  . ESOPHAGOGASTRODUODENOSCOPY    . ESOPHAGOGASTRODUODENOSCOPY  10/19/2015   Procedure: ESOPHAGOGASTRODUODENOSCOPY (EGD);  Surgeon: Lollie Sails, MD;  Location: [facility] ENDOSCOPY;  Service: Endoscopy;;  . ESOPHAGOGASTRODUODENOSCOPY (EGD) WITH PROPOFOL N/A 03/14/2016   Procedure: ESOPHAGOGASTRODUODENOSCOPY (EGD) WITH PROPOFOL;  Surgeon: Manya Silvas, MD;  Location: [facility] ENDOSCOPY;  Service: Endoscopy;  Laterality: N/A;  . GALLBLADDER SURGERY    . THYROID SURGERY      OB History   None      Home Medications    Prior to Admission medications   Medication Sig Start Date End Date Taking? Authorizing Provider  clobetasol ointment (TEMOVATE) 6.43 % Apply 1 application topically 2 (two) times daily.   Yes [provider]  levothyroxine (SYNTHROID, LEVOTHROID) 75 MCG tablet Take 1 tablet by mouth daily. 07/27/15  Yes [provider]  metoprolol succinate (TOPROL-XL) 25 MG 24 hr tablet  07/17/15  Yes [provider]  omeprazole (PRILOSEC) 20 MG capsule Take 20 mg by mouth daily.     Yes [provider]  pantoprazole (PROTONIX) 40 MG tablet  08/05/16  Yes [provider]  rOPINIRole (REQUIP) 0.25 MG tablet Take by mouth. 07/12/15 12/18/17 Yes [provider]  sertraline (ZOLOFT) 50 MG tablet Take 50 mg by mouth daily.     Yes [provider]  simvastatin (ZOCOR) 40 MG tablet Take 40 mg by mouth at bedtime.     Yes [provider]  traMADol (ULTRAM) 50 MG tablet Take 50 mg by mouth every 6 (six) hours as needed.     Yes [provider]  XARELTO 20 MG TABS tablet Take 1 tablet by mouth daily. 07/25/15  Yes [provider]  cephALEXin (KEFLEX) 500 MG capsule Take 1 capsule (500 mg total) by mouth 2 (two) times daily for 7 days.  12/18/17 12/25/17  Madie Cahn, Aura Fey, NP  dofetilide (TIKOSYN) 125 MCG capsule Take by mouth.    [provider]    Family History History reviewed. No pertinent family history.  Social History Social History   Tobacco Use  . Smoking status: Current Every Day Smoker    Packs/day: 1.00  . Smokeless tobacco: Never Used  Substance Use Topics  . Alcohol use: No    Alcohol/week: 0.0 oz  . Drug use: No     Allergies   Codeine and Hydrocodone   Review of Systems Review of Systems  Constitutional: Negative for chills, diaphoresis and fever.  HENT: Positive for ear pain. Negative for ear discharge, sore throat, tinnitus, trouble swallowing and voice change.   Eyes: Negative for photophobia and visual disturbance.  Respiratory: Negative for chest tightness, shortness of breath and wheezing.   Cardiovascular: Negative for chest pain.  Gastrointestinal: Negative for diarrhea, nausea and vomiting.  Endocrine: Negative for cold intolerance and heat intolerance.  Musculoskeletal: Negative for neck pain and neck stiffness.  Skin: Positive for color change and rash. Negative for pallor and wound.  Allergic/Immunologic: Negative for food allergies.  Neurological: Negative for dizziness, tremors, seizures, syncope, facial asymmetry, speech difficulty, weakness, light-headedness, numbness and headaches.  Hematological: Negative for adenopathy. Does not bruise/bleed easily.  Psychiatric/Behavioral: Positive for sleep disturbance. Negative for agitation and confusion.     Physical Exam Triage Vital Signs ED Triage Vitals  Enc Vitals Group     BP 12/18/17 1333 131/84     Pulse Rate 12/18/17 1333 80     Resp 12/18/17 1333 18     Temp 12/18/17 1333 97.8 F (36.6 C)     Temp Source 12/18/17 1333 Oral     SpO2 12/18/17 1333 100 %     Weight 12/18/17 1334 155 lb (70.3 kg)     Height 12/18/17 1334 5\' 9"  (1.753 m)     Head Circumference --      Peak Flow --      Pain Score  12/18/17 1334 0     Pain Loc --      Pain Edu? --      Excl. in [place]? --    No data found.  Updated Vital Signs BP 131/84 (BP Location: Left Arm)   Pulse 80   Temp 97.8 F (36.6 C) (Oral)   Resp 18   Ht 5\' 9"  (1.753 m)   Wt 155 lb (70.3 kg)   SpO2 100%   BMI 22.89 kg/m   Visual Acuity Right Eye Distance:   Left Eye Distance:   Bilateral Distance:    Right Eye Near:   Left Eye Near:    Bilateral Near:     Physical Exam  Constitutional: She is oriented to person, place, and time. Vital  signs are normal. She appears well-developed and well-nourished. She is active and cooperative.  Non-toxic appearance. She does not have a sickly appearance. She does not appear ill. No distress.  HENT:  Head: Normocephalic and atraumatic.  Right Ear: Hearing, external ear and ear canal normal. A middle ear effusion is present.  Left Ear: Hearing and ear canal normal. There is swelling and tenderness. A middle ear effusion is present.  Ears:  Nose: Mucosal edema and rhinorrhea present. No nose lacerations, sinus tenderness, nasal deformity, septal deviation or nasal septal hematoma. No epistaxis.  No foreign bodies. Right sinus exhibits no maxillary sinus tenderness and no frontal sinus tenderness. Left sinus exhibits no maxillary sinus tenderness and no frontal sinus tenderness.  Mouth/Throat: Uvula is midline and mucous membranes are normal. Mucous membranes are not pale, not dry and not cyanotic. No oral lesions. No trismus in the jaw. Normal dentition. No dental abscesses, uvula swelling, lacerations or dental caries. Posterior oropharyngeal edema and posterior oropharyngeal erythema present. No oropharyngeal exudate or tonsillar abscesses.  Cobblestoning posterior pharynx; bilateral TMs air fluid level clear; bilateral allergic shiners; left earlobe with localized swelling adjacent to ear piercing single tenderness macular erythema left; tragus and superior pinna/canal not TTP or erythematous;  skin facial/ears dry scaly "dandruff"; bilateral nasal turbinates edema/erythema clear discharge  Eyes: Pupils are equal, round, and reactive to light. Conjunctivae, EOM and lids are normal. Right eye exhibits no chemosis, no discharge, no exudate and no hordeolum. No foreign body present in the right eye. Left eye exhibits no chemosis, no discharge, no exudate and no hordeolum. No foreign body present in the left eye. Right conjunctiva is not injected. Right conjunctiva has no hemorrhage. Left conjunctiva is not injected. Left conjunctiva has no hemorrhage. No scleral icterus. Right eye exhibits normal extraocular motion and no nystagmus. Left eye exhibits normal extraocular motion and no nystagmus. Right pupil is round and reactive. Left pupil is round and reactive. Pupils are equal.  Neck: Trachea normal and normal range of motion. Neck supple. No tracheal tenderness and no muscular tenderness present. No neck rigidity. No tracheal deviation, no edema, no erythema and normal range of motion present. No thyroid mass and no thyromegaly present.  Cardiovascular: Normal rate, regular rhythm, S1 normal, S2 normal, normal heart sounds and intact distal pulses. PMI is not displaced. Exam reveals no gallop, no distant heart sounds and no friction rub.  No murmur heard. Pulmonary/Chest: Effort normal and breath sounds normal. No accessory muscle usage or stridor. No respiratory distress. She has no decreased breath sounds. She has no wheezes. She has no rhonchi. She has no rales. She exhibits no tenderness.  Spoke full sentences without difficulty; no cough observed in exam room  Abdominal: Soft. Normal appearance. She exhibits no distension, no fluid wave and no mass. There is no rigidity and no guarding.  Musculoskeletal: Normal range of motion. She exhibits no edema or tenderness.       Right shoulder: Normal.       Left shoulder: Normal.       Right elbow: Normal.      Left elbow: Normal.       Right hip:  Normal.       Left hip: Normal.       Right knee: Normal.       Left knee: Normal.       Cervical back: Normal.       Thoracic back: Normal.       Lumbar back: Normal.  Right hand: Normal.       Left hand: Normal.  Lymphadenopathy:       Head (right side): No submental, no submandibular, no tonsillar, no preauricular, no posterior auricular and no occipital adenopathy present.       Head (left side): No submental, no submandibular, no tonsillar, no preauricular, no posterior auricular and no occipital adenopathy present.    She has no cervical adenopathy.       Right cervical: No superficial cervical, no deep cervical and no posterior cervical adenopathy present.      Left cervical: No superficial cervical, no deep cervical and no posterior cervical adenopathy present.  Neurological: She is alert and oriented to person, place, and time. She has normal strength. She is not disoriented. She displays no atrophy and no tremor. No cranial nerve deficit or sensory deficit. She exhibits normal muscle tone. She displays no seizure activity. Coordination and gait normal. GCS eye subscore is 4. GCS verbal subscore is 5. GCS motor subscore is 6.  In/out of chair without difficulty; gait sure and steady in hallway  Skin: Skin is warm, dry and intact. Capillary refill takes less than 2 seconds. Rash noted. No abrasion, no bruising, no burn, no ecchymosis, no laceration, no lesion, no petechiae and no purpura noted. Rash is macular. Rash is not papular, not maculopapular, not nodular, not pustular, not vesicular and not urticarial. She is not diaphoretic. There is erythema. No cyanosis. No pallor. Nails show no clubbing.     Localized macular erythema anterior and posterior left earlobe; dry flaky skin pronounced face/neck less so arms/legs  Psychiatric: She has a normal mood and affect. Her speech is normal and behavior is normal. Judgment and thought content normal. She is not actively hallucinating.  Cognition and memory are normal. She is attentive.  Nursing note and vitals reviewed.    UC Treatments / Results  Labs (all labs ordered are listed, but only abnormal results are displayed) Labs Reviewed - No data to display  EKG None Radiology No results found.  Procedures Procedures (including critical care time)  Medications Ordered in UC Medications - No data to display   Initial Impression / Assessment and Plan / UC Course  I have reviewed the triage vital signs and the nursing notes.  Pertinent labs & imaging results that were available during my care of the patient were reviewed by me and considered in my medical decision making (see chart for details).     Cellulitis left ear  Final Clinical Impressions(s) / UC Diagnoses   Final diagnoses:  Cellulitis of face    ED Discharge Orders        Ordered    cephALEXin (KEFLEX) 500 MG capsule  2 times daily     12/18/17 1419     P-Exitcare handout on skin infection given to patient.  RTC if worsening erythema, pain, purulent discharge, fever.  Wash towels, washcloths, sheets in hot water with bleach every couple of days until infection resolved.  Patient verbalized understanding, agreed with plan of care and had no further questions at this time.    Wash face/ears with soap and water twice a day Do not scratch ear--apply bandage over affected area if continually picking or scratching at it May perform cryotherapy 5 minutes three times a day prn swelling/itching--trial in exam room helped patient reported itching relief Tylenol 1000mg  by mouth every 6 hours as needed for pain Topical triple antibiotic twice a day after washing ear has neosporin at home  Keflex 500mg  every 8 hours x 7 days #14 RF0 electronic Rx given follow up if purulent discharge, fever, vision changes, difficulty swallowing/breathing or worsening ear/facial swelling  Patient has clobetasol at home. Avoid scratching left ear discussed with patient  dry skin less able to protect against bacteria getting underneath skin and causing infection.  Discussed may apply clobetasol once a day to dry scaly skin on ear other option buy hydrocortisone 1% and may apply BID prn itching or continue benadryl topical BID prn itching and supplement with ice for 5 minutes TID.  Discussed application of emollient BID to all dry skin areas e.g. vaseline fragrance free.  Medication as directed.  Symptomatic therapy suggested.  Warm to cool water soaks and/or oatmeal baths.  Call or return to clinic as needed if these symptoms worsen or fail to improve as anticipated.  Exitcare handout on dermatitis given to patient.  Patient verbalized agreement and understanding of treatment plan.   P2:  Avoidance and hand washing.  Controlled Substance Prescriptions [place] Controlled Substance Registry consulted?No   Olen Cordial, NP 12/18/17 2205

## 2018-03-14 ENCOUNTER — Other Ambulatory Visit: Payer: Self-pay | Admitting: Obstetrics and Gynecology

## 2018-03-14 DIAGNOSIS — N761 Subacute and chronic vaginitis: Secondary | ICD-10-CM

## 2018-03-27 ENCOUNTER — Other Ambulatory Visit: Payer: Self-pay | Admitting: Obstetrics and Gynecology

## 2018-03-27 DIAGNOSIS — N761 Subacute and chronic vaginitis: Secondary | ICD-10-CM

## 2018-04-15 DIAGNOSIS — Z0000 Encounter for general adult medical examination without abnormal findings: Secondary | ICD-10-CM | POA: Insufficient documentation

## 2018-06-04 ENCOUNTER — Ambulatory Visit
Admission: EM | Admit: 2018-06-04 | Discharge: 2018-06-04 | Disposition: A | Discharge status: Home / Self Care | Payer: Medicare Other | Attending: Emergency Medicine | Admitting: Emergency Medicine

## 2018-06-04 ENCOUNTER — Encounter: Payer: Self-pay | Admitting: Emergency Medicine

## 2018-06-04 ENCOUNTER — Other Ambulatory Visit: Payer: Self-pay

## 2018-06-04 DIAGNOSIS — S60467A Insect bite (nonvenomous) of left little finger, initial encounter: Secondary | ICD-10-CM | POA: Diagnosis not present

## 2018-06-04 DIAGNOSIS — W57XXXA Bitten or stung by nonvenomous insect and other nonvenomous arthropods, initial encounter: Secondary | ICD-10-CM

## 2018-06-04 DIAGNOSIS — L089 Local infection of the skin and subcutaneous tissue, unspecified: Secondary | ICD-10-CM

## 2018-06-04 MED ORDER — CEPHALEXIN 500 MG PO CAPS: 1000.0000 mg | ORAL_CAPSULE | Freq: Two times a day (BID) | ORAL | 0 refills | Status: AC

## 2018-06-04 MED ORDER — PREDNISONE 20 MG PO TABS: 40.0000 mg | ORAL_TABLET | Freq: Every day | ORAL | 0 refills | Status: AC

## 2018-06-04 MED ORDER — MUPIROCIN 2 % EX OINT: 1.0000 "application " | TOPICAL_OINTMENT | Freq: Three times a day (TID) | CUTANEOUS | 0 refills | Status: DC

## 2018-06-04 NOTE — ED Triage Notes (Addendum)
Patient in today c/o an insect bite on her left pinky finger while working in [place] on Wednesday (06/02/18). Patient states the rash is extremely itchy. Patient has used OTC Epson salts, Vinegar and Benadryl without relief.

## 2018-06-04 NOTE — Discharge Instructions (Addendum)
40 mg of prednisone for 5 days which will help with the itching, Keflex thousand milligrams twice daily for 7 days which will help with the infection, discontinue the Benadryl, try Claritin or Zyrtec.  Bactroban for the scratches on your forearm to help prevent secondary infection.  Continue local wound care.  Follow-up with your primary care physician as needed.

## 2018-06-04 NOTE — ED Provider Notes (Signed)
HPI  SUBJECTIVE:  Elizabeth Boone is a 84 y.o. female who presents with an insect bite on her left little finger 2 days ago.  Patient states that she was pruning bushes, grabbed a handful of bush, and felt something bite her.  She reports significant itching over her hand, and forearm.  She reports erythema, swelling of her left little finger.  No limitation of motion, fevers, body aches, purulent drainage.  She denies any finger pain.  She tried vinegar, Epson salt soaks, topical and oral Benadryl without improvement in her symptoms.  States that she cannot sleep due to the itching.  No aggravating factors.  Past medical history of atrial fibrillation, sick sinus syndrome status post pacemaker, hypertension.  No history of diabetes, MRSA.  Tetanus was updated 6 weeks ago.  EVO:[id number], Christean Grief, MD   Past Medical History:  Diagnosis Date  . Aortic regurgitation   . Arthritis   . Atrial fibrillation ([place])   . CAD (coronary artery disease)   . 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 Cardiac pacemaker   . Erosive gastritis   . Esophageal stricture   . Hearing loss   . Hiatal hernia   . Hyperlipidemia   . Hypertension   . Hypothyroidism   . Lipoma of thigh 2015   Outer  . Pacemaker   . Previous back surgery   . Shingles   . Thyroid disease   . Vocal cord polyp   . Wears dentures   . Wears glasses     Past Surgical History:  Procedure Laterality Date  . ABDOMINAL HYSTERECTOMY    . BACK SURGERY    . BALLOON DILATION N/A 03/14/2016   Procedure: BALLOON DILATION;  Surgeon: Manya Silvas, MD;  Location: [facility] ENDOSCOPY;  Service: Endoscopy;  Laterality: N/A;  . CATARACT EXTRACTION    . CHOLECYSTECTOMY    . COLONOSCOPY    . COLONOSCOPY WITH PROPOFOL N/A 10/19/2015   Procedure: COLONOSCOPY WITH PROPOFOL;  Surgeon: Lollie Sails, MD;  Location: [facility] ENDOSCOPY;  Service: Endoscopy;  Laterality: N/A;  . ESOPHAGOGASTRODUODENOSCOPY    . ESOPHAGOGASTRODUODENOSCOPY  10/19/2015   Procedure:  ESOPHAGOGASTRODUODENOSCOPY (EGD);  Surgeon: Lollie Sails, MD;  Location: [facility] ENDOSCOPY;  Service: Endoscopy;;  . ESOPHAGOGASTRODUODENOSCOPY (EGD) WITH PROPOFOL N/A 03/14/2016   Procedure: ESOPHAGOGASTRODUODENOSCOPY (EGD) WITH PROPOFOL;  Surgeon: Manya Silvas, MD;  Location: [facility] ENDOSCOPY;  Service: Endoscopy;  Laterality: N/A;  . GALLBLADDER SURGERY    . THYROID SURGERY      Family History  Problem Relation Age of Onset  . Dementia Mother   . Heart attack Father   . Stroke Sister   . Prostate cancer Brother   . Pancreatic cancer Sister     Social History   Tobacco Use  . Smoking status: Current Every Day Smoker    Packs/day: 1.00  . Smokeless tobacco: Never Used  Substance Use Topics  . Alcohol use: No    Alcohol/week: 0.0 standard drinks  . Drug use: No    No current facility-administered medications for this encounter.   Current Outpatient Medications:  .  clobetasol ointment (TEMOVATE) 8.18 %, Apply 1 application topically 2 (two) times daily., Disp: , Rfl:  .  dofetilide (TIKOSYN) 125 MCG capsule, Take by mouth., Disp: , Rfl:  .  levothyroxine (SYNTHROID, LEVOTHROID) 75 MCG tablet, Take 1 tablet by mouth daily., Disp: , Rfl: 3 .  metoprolol succinate (TOPROL-XL) 25 MG 24 hr tablet, , Disp: , Rfl:  .  pantoprazole (PROTONIX) 40 MG tablet, , Disp: , Rfl:  .  rOPINIRole (REQUIP) 0.25 MG tablet, Take by mouth., Disp: , Rfl:  .  simvastatin (ZOCOR) 40 MG tablet, Take 40 mg by mouth at bedtime.  , Disp: , Rfl:  .  traMADol (ULTRAM) 50 MG tablet, Take 50 mg by mouth every 6 (six) hours as needed.  , Disp: , Rfl:  .  XARELTO 20 MG TABS tablet, Take 1 tablet by mouth daily., Disp: , Rfl:  .  cephALEXin (KEFLEX) 500 MG capsule, Take 2 capsules (1,000 mg total) by mouth 2 (two) times daily for 7 days., Disp: 28 capsule, Rfl: 0 .  mupirocin ointment (BACTROBAN) 2 %, Apply 1 application topically 3 (three) times daily., Disp: 22 g, Rfl: 0 .  predniSONE (DELTASONE) 20 MG  tablet, Take 2 tablets (40 mg total) by mouth daily with breakfast for 5 days., Disp: 10 tablet, Rfl: 0  Allergies  Allergen Reactions  . Codeine   . Hydrocodone Nausea Only     ROS  As noted in HPI.   Physical Exam  BP 119/67 (BP Location: Right Arm)   Pulse 84   Temp 97.8 F (36.6 C) (Oral)   Resp 20   Ht 5\' 9"  (1.753 m)   Wt 68 kg   SpO2 98%   BMI 22.15 kg/m   Constitutional: Well developed, well nourished, no acute distress Eyes:  EOMI, conjunctiva normal bilaterally HENT: Normocephalic, atraumatic,mucus membranes moist Respiratory: Normal inspiratory effort Cardiovascular: Normal rate GI: nondistended skin: Excoriations left forearm   Musculoskeletal: Positive nontender erythema, edema of left little finger.  No flexor tendon tenderness.  Sensation to temperature light touch grossly intact.  Positive area that is consistent with a sting versus a bite in the middle phalanx.  No foreign body seen.  No limitation of motion.        Neurologic: Alert & oriented x 3, no focal neuro deficits Psychiatric: Speech and behavior appropriate   ED Course   Medications - No data to display  No orders of the defined types were placed in this encounter.   No results found for this or any previous visit (from the past 24 hour(s)). No results found.  ED Clinical Impression  Insect bite of left little finger, initial encounter  Bug bite with infection, initial encounter   ED Assessment/Plan  Presentation consistent with an infected insect sting.  No evidence of flexor tenosynovitis.  Will send home with 40 mg of prednisone for 5 days, Keflex thousand milligrams twice daily for 7 days, discontinue the Benadryl, try Claritin or Zyrtec.  Bactroban for the scratches on her forearm to help prevent secondary infection.  Continue local wound care.  Follow-up with PMD as needed.  Discussed MDM, treatment plan, and plan for follow-up with patient. patient agrees with  plan.   Meds ordered this encounter  Medications  . cephALEXin (KEFLEX) 500 MG capsule    Sig: Take 2 capsules (1,000 mg total) by mouth 2 (two) times daily for 7 days.    Dispense:  28 capsule    Refill:  0  . mupirocin ointment (BACTROBAN) 2 %    Sig: Apply 1 application topically 3 (three) times daily.    Dispense:  22 g    Refill:  0  . predniSONE (DELTASONE) 20 MG tablet    Sig: Take 2 tablets (40 mg total) by mouth daily with breakfast for 5 days.    Dispense:  10 tablet    Refill:  0    *This clinic note was created using [employer]. Therefore, there may be occasional mistakes despite careful proofreading.   ?   Melynda Ripple, MD 06/04/18 1202

## 2020-01-06 DIAGNOSIS — G8929 Other chronic pain: Secondary | ICD-10-CM | POA: Insufficient documentation

## 2020-03-24 ENCOUNTER — Ambulatory Visit (INDEPENDENT_AMBULATORY_CARE_PROVIDER_SITE_OTHER): Payer: Medicare Other

## 2020-03-24 ENCOUNTER — Encounter: Payer: Self-pay | Admitting: Emergency Medicine

## 2020-03-24 ENCOUNTER — Ambulatory Visit
Admission: EM | Admit: 2020-03-24 | Discharge: 2020-03-24 | Disposition: A | Discharge status: Home / Self Care | Payer: Medicare Other | Attending: Emergency Medicine | Admitting: Emergency Medicine

## 2020-03-24 ENCOUNTER — Other Ambulatory Visit: Payer: Self-pay

## 2020-03-24 DIAGNOSIS — M25471 Effusion, right ankle: Secondary | ICD-10-CM | POA: Diagnosis not present

## 2020-03-24 DIAGNOSIS — S9001XA Contusion of right ankle, initial encounter: Secondary | ICD-10-CM

## 2020-03-24 DIAGNOSIS — R600 Localized edema: Secondary | ICD-10-CM

## 2020-03-24 NOTE — Discharge Instructions (Signed)
Your ankle x-ray was negative for fracture.  I do not think that you have a blood clot in your leg today based on your exam.  I suspect that your swelling is coming from excess salt intake.  Try the compression stockings, put them on in the morning when the swelling is at its lowest, elevate your feet above your heart if the swelling becomes bothersome.  Cut down on your sodium intake.  You can start by not adding salt to your food.  Follow-up with your primary care physician in several days if this does not work, go immediately to the ED for chest pain, shortness of breath, fevers above 100.4, if your leg becomes swollen and painful, redness at the ankle joint, or for other concerns

## 2020-03-24 NOTE — ED Provider Notes (Signed)
HPI  SUBJECTIVE:  Elizabeth Boone is a 86 y.o. female who presents with 2 days of right ankle foot and swelling that is better in the morning, worse in the afternoon.  She reports 3 months of right medial ankle discoloration that she thinks is a bruise.  It has not changed in the past 3 months.  No trauma to the ankle.  She denies twisting it, fall, change in physical activity.  She is states that the ankle "feels tight" when it is swollen.  Denies any other pain.  No joint erythema.  No fevers, nausea, tingling in her foot.  No leg swelling, calf pain.  She states that only her foot and ankle swell up.  She has never had symptoms like this before.  She states that she eats a lot of salt and adds salt to her food.  She has not changed her salt intake recently.  She states that she is very active.  Symptoms are worse in the afternoon after standing.  No alleviating factors.  She has not tried anything for this. She has a past medical history of hypertension, smoking, atrial fibrillation on Xarelto.  No history of diabetes, osteoporosis, peripheral vascular disease, peripheral arterial disease.  OEV:OJJKKX, Christean Grief, MD  Past Medical History:  Diagnosis Date  . Aortic regurgitation   . Arthritis   . Atrial fibrillation ([place])   . CAD (coronary artery disease)   . 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 Cardiac pacemaker   . Erosive gastritis   . Esophageal stricture   . Hearing loss   . Hiatal hernia   . Hyperlipidemia   . Hypertension   . Hypothyroidism   . Lipoma of thigh 2015   Outer  . Pacemaker   . Previous back surgery   . Shingles   . Thyroid disease   . Vocal cord polyp   . Wears dentures   . Wears glasses     Past Surgical History:  Procedure Laterality Date  . ABDOMINAL HYSTERECTOMY    . BACK SURGERY    . BALLOON DILATION N/A 03/14/2016   Procedure: BALLOON DILATION;  Surgeon: Manya Silvas, MD;  Location: [facility] ENDOSCOPY;  Service: Endoscopy;  Laterality: N/A;  . CATARACT  EXTRACTION    . CHOLECYSTECTOMY    . COLONOSCOPY    . COLONOSCOPY WITH PROPOFOL N/A 10/19/2015   Procedure: COLONOSCOPY WITH PROPOFOL;  Surgeon: Lollie Sails, MD;  Location: [facility] ENDOSCOPY;  Service: Endoscopy;  Laterality: N/A;  . ESOPHAGOGASTRODUODENOSCOPY    . ESOPHAGOGASTRODUODENOSCOPY  10/19/2015   Procedure: ESOPHAGOGASTRODUODENOSCOPY (EGD);  Surgeon: Lollie Sails, MD;  Location: [facility] ENDOSCOPY;  Service: Endoscopy;;  . ESOPHAGOGASTRODUODENOSCOPY (EGD) WITH PROPOFOL N/A 03/14/2016   Procedure: ESOPHAGOGASTRODUODENOSCOPY (EGD) WITH PROPOFOL;  Surgeon: Manya Silvas, MD;  Location: [facility] ENDOSCOPY;  Service: Endoscopy;  Laterality: N/A;  . GALLBLADDER SURGERY    . THYROID SURGERY      Family History  Problem Relation Age of Onset  . Dementia Mother   . Heart attack Father   . Stroke Sister   . Prostate cancer Brother   . Pancreatic cancer Sister     Social History   Tobacco Use  . Smoking status: Current Every Day Smoker    Packs/day: 1.00    Years: 70.00    Pack years: 70.00    Types: Cigarettes  . Smokeless tobacco: Never Used  Vaping Use  . Vaping Use: Never used  Substance Use Topics  . Alcohol use:  No    Alcohol/week: 0.0 standard drinks  . Drug use: No    No current facility-administered medications for this encounter.  Current Outpatient Medications:  .  clobetasol ointment (TEMOVATE) 4.25 %, Apply 1 application topically 2 (two) times daily., Disp: , Rfl:  .  dofetilide (TIKOSYN) 125 MCG capsule, Take by mouth., Disp: , Rfl:  .  levothyroxine (SYNTHROID, LEVOTHROID) 75 MCG tablet, Take 1 tablet by mouth daily., Disp: , Rfl: 3 .  metoprolol succinate (TOPROL-XL) 25 MG 24 hr tablet, , Disp: , Rfl:  .  pantoprazole (PROTONIX) 40 MG tablet, , Disp: , Rfl:  .  rOPINIRole (REQUIP) 0.25 MG tablet, Take by mouth., Disp: , Rfl:  .  simvastatin (ZOCOR) 40 MG tablet, Take 40 mg by mouth at bedtime.  , Disp: , Rfl:  .  traMADol (ULTRAM) 50 MG tablet, Take 50  mg by mouth every 6 (six) hours as needed.  , Disp: , Rfl:  .  XARELTO 20 MG TABS tablet, Take 1 tablet by mouth daily., Disp: , Rfl:   Allergies  Allergen Reactions  . Codeine   . Hydrocodone Nausea Only  . Onion     Other reaction(s): Other (See Comments) Raw onion     ROS  As noted in HPI.   Physical Exam  BP (!) 118/94 (BP Location: Left Arm)   Pulse 78   Temp 97.9 F (36.6 C) (Oral)   Resp 18   Ht 5' 8.5" (1.74 m)   Wt 72.6 kg   SpO2 100%   BMI 23.97 kg/m   Constitutional: Well developed, well nourished, no acute distress Eyes:  EOMI, conjunctiva normal bilaterally HENT: Normocephalic, atraumatic,mucus membranes moist Respiratory: Normal inspiratory effort Cardiovascular: Normal rate GI: nondistended skin: No rash, skin intact Musculoskeletal: Calves symmetric, nontender.  No palpable cord.  No edema right lower extremity.  Positive nontender purplish discoloration consistent with a bruise anterior to the medial malleolus.  Skin intact.  No evidence of infection, blister.  Medial malleolus tender.  Medial, lateral ankle ligaments nontender.  Distal fibula nontender.  No tenderness over the Achilles, calcaneus, midfoot, base of fifth metatarsal.  No pain with full range of motion of the ankle.  Ankle stable.  DP 2+.  Cap refill 2 seconds.  Sensation grossly intact over entire foot.  Patient able to move all toes.       Neurologic: Alert & oriented x 3, no focal neuro deficits Psychiatric: Speech and behavior appropriate   ED Course   Medications - No data to display  Orders Placed This Encounter  Procedures  . DG Ankle Complete Right    Standing Status:   Standing    Number of Occurrences:   1    Order Specific Question:   Reason for Exam (SYMPTOM  OR DIAGNOSIS REQUIRED)    Answer:   Tenderness medial malleolus.  Ankle and foot swelling.  No trauma.    No results found for this or any previous visit (from the past 24 hour(s)). DG Ankle Complete  Right  Result Date: 03/24/2020 CLINICAL DATA:  Medial malleolus tenderness. Ankle and foot swelling. No trauma. EXAM: RIGHT ANKLE - COMPLETE 3+ VIEW COMPARISON:  None. FINDINGS: Mild soft tissue swelling along the medial aspect of the ankle. Negative for an acute fracture or dislocation. Alignment of the ankle is normal. Prominent enthesopathic changes at the insertion of the Achilles tendon. There is plantar calcaneal spurring. IMPRESSION: 1. Soft tissue swelling along the medial malleolus without acute bone abnormality.  2. Calcaneal spurring. Electronically Signed   By: Markus Daft M.D.   On: 03/24/2020 09:15    ED Clinical Impression  1. Edema of right foot   2. Edema of right ankle   3. Contusion of right ankle, initial encounter      ED Assessment/Plan  Doubt gout, septic joint, DVT acute arterial or vascular insufficiency.  Her physical exam is unremarkable except for the hematoma/purplish discoloration and soft tissue swelling anterior to the medial malleolus and the medial malleolus tenderness.  Thus will get x-ray to rule out any acute changes given the bony tenderness.  She is active, and is on Eliquis which makes DVT less likely.  She states that she eats a lot of salt, suspect that this is the cause of her symptoms versus some vascular insufficiency.  As for the area of discoloration, suspect this is from [place].  Advised compression stockings, decrease sodium, elevation above heart when ankles become swollen.  Follow-up with PMD in several days.  Discussed signs and symptoms that should prompt return to the ED.  Reviewed imaging independently.  No fracture.  Positive soft tissue swelling medial malleolus.  See radiology report for full details.  Plan as above.  Discussed imaging, MDM, treatment plan, and plan for follow-up with patient. Discussed sn/sx that should prompt return to the ED. patient agrees with plan.   No orders of the defined types were placed in this  encounter.   *This clinic note was created using Dragon dictation software. Therefore, there may be occasional mistakes despite careful proofreading.   ?    Melynda Ripple, MD 03/24/20 [phone]

## 2020-03-24 NOTE — ED Triage Notes (Signed)
Patient in today c/o the appearance of bruising on her right ankle x 3 months. Patient states her ankle started swelling 2 days ago. No injury noted. Patient has not tried any OTC medications.

## 2020-04-26 DIAGNOSIS — F330 Major depressive disorder, recurrent, mild: Secondary | ICD-10-CM | POA: Insufficient documentation

## 2020-04-30 ENCOUNTER — Other Ambulatory Visit: Payer: Self-pay | Admitting: Family Medicine

## 2020-04-30 DIAGNOSIS — R42 Dizziness and giddiness: Secondary | ICD-10-CM

## 2020-05-08 ENCOUNTER — Other Ambulatory Visit: Payer: Medicare Other

## 2020-05-14 ENCOUNTER — Ambulatory Visit
Admission: RE | Admit: 2020-05-14 | Discharge: 2020-05-14 | Disposition: A | Discharge status: Home / Self Care | Payer: Medicare Other | Source: Ambulatory Visit | Attending: Family Medicine | Admitting: Family Medicine

## 2020-05-14 ENCOUNTER — Other Ambulatory Visit: Payer: Self-pay

## 2020-05-14 DIAGNOSIS — R42 Dizziness and giddiness: Secondary | ICD-10-CM | POA: Diagnosis present

## 2020-07-16 ENCOUNTER — Ambulatory Visit
Admission: EM | Admit: 2020-07-16 | Discharge: 2020-07-16 | Disposition: A | Discharge status: Home / Self Care | Payer: Medicare Other | Attending: Emergency Medicine | Admitting: Emergency Medicine

## 2020-07-16 ENCOUNTER — Other Ambulatory Visit: Payer: Self-pay

## 2020-07-16 ENCOUNTER — Ambulatory Visit (INDEPENDENT_AMBULATORY_CARE_PROVIDER_SITE_OTHER): Payer: Medicare Other

## 2020-07-16 DIAGNOSIS — Z23 Encounter for immunization: Secondary | ICD-10-CM

## 2020-07-16 DIAGNOSIS — S80212A Abrasion, left knee, initial encounter: Secondary | ICD-10-CM | POA: Diagnosis not present

## 2020-07-16 DIAGNOSIS — W19XXXA Unspecified fall, initial encounter: Secondary | ICD-10-CM

## 2020-07-16 DIAGNOSIS — M79662 Pain in left lower leg: Secondary | ICD-10-CM

## 2020-07-16 DIAGNOSIS — S41111A Laceration without foreign body of right upper arm, initial encounter: Secondary | ICD-10-CM

## 2020-07-16 DIAGNOSIS — M79631 Pain in right forearm: Secondary | ICD-10-CM | POA: Diagnosis not present

## 2020-07-16 MED ORDER — TETANUS-DIPHTHERIA TOXOIDS TD 5-2 LFU IM INJ
0.5000 mL | INJECTION | Freq: Once | INTRAMUSCULAR | Status: AC
  Administered 2020-07-16: 0.5 mL via INTRAMUSCULAR

## 2020-07-16 MED ORDER — TETANUS-DIPHTH-ACELL PERTUSSIS 5-2.5-18.5 LF-MCG/0.5 IM SUSP: 0.5000 mL | Freq: Once | INTRAMUSCULAR | Status: DC

## 2020-07-16 MED ORDER — MUPIROCIN CALCIUM 2 % NA OINT: TOPICAL_OINTMENT | NASAL | 0 refills | Status: AC

## 2020-07-16 NOTE — ED Provider Notes (Signed)
[facility]    CSN: [id number] Arrival date & time: 07/16/20  1600      History   Chief Complaint Chief Complaint  Patient presents with  . Fall  . skin tear    multiple    HPI Elizabeth Boone is a 86 y.o. female.   86 year old female presents for evaluation after a fall.  She reports that she was walking down 4 concrete steps into her garage with a laundry bag last night around 9 PM, missed the last step, and fell onto concrete.  She denies hitting her head or loss of consciousness.  She denies visual changes or nausea or vomiting.  Patient has 2 large skin tears to her right forearm and a single large skin tear to her left knee.  The wound beds have grit in them but no active bleeding.      Past Medical History:  Diagnosis Date  . Aortic regurgitation   . Arthritis   . Atrial fibrillation ([place])   . CAD (coronary artery disease)   . 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 Cardiac pacemaker   . Erosive gastritis   . Esophageal stricture   . Hearing loss   . Hiatal hernia   . Hyperlipidemia   . Hypertension   . Hypothyroidism   . Lipoma of thigh 2015   Outer  . Pacemaker   . Previous back surgery   . Shingles   . Thyroid disease   . Vocal cord polyp   . Wears dentures   . Wears glasses     Patient Active Problem List   Diagnosis Date Noted  . Soft tissue mass 09/18/2015  . BP (high blood pressure) 09/13/2015  . Adult hypothyroidism 09/13/2015  . Sick sinus syndrome ([place]) 09/13/2015  . Combined fat and carbohydrate induced hyperlipemia 02/27/2015  . H/O malignant neoplasm of head and neck 02/08/2015  . Atrial fibrillation ([place]) 10/30/2011    Past Surgical History:  Procedure Laterality Date  . ABDOMINAL HYSTERECTOMY    . BACK SURGERY    . BALLOON DILATION N/A 03/14/2016   Procedure: BALLOON DILATION;  Surgeon: Manya Silvas, MD;  Location: [facility] ENDOSCOPY;  Service: Endoscopy;  Laterality: N/A;  . CATARACT EXTRACTION    . CHOLECYSTECTOMY    .  COLONOSCOPY    . COLONOSCOPY WITH PROPOFOL N/A 10/19/2015   Procedure: COLONOSCOPY WITH PROPOFOL;  Surgeon: Lollie Sails, MD;  Location: [facility] ENDOSCOPY;  Service: Endoscopy;  Laterality: N/A;  . ESOPHAGOGASTRODUODENOSCOPY    . ESOPHAGOGASTRODUODENOSCOPY  10/19/2015   Procedure: ESOPHAGOGASTRODUODENOSCOPY (EGD);  Surgeon: Lollie Sails, MD;  Location: [facility] ENDOSCOPY;  Service: Endoscopy;;  . ESOPHAGOGASTRODUODENOSCOPY (EGD) WITH PROPOFOL N/A 03/14/2016   Procedure: ESOPHAGOGASTRODUODENOSCOPY (EGD) WITH PROPOFOL;  Surgeon: Manya Silvas, MD;  Location: [facility] ENDOSCOPY;  Service: Endoscopy;  Laterality: N/A;  . GALLBLADDER SURGERY    . THYROID SURGERY      OB History   No obstetric history on file.      Home Medications    Prior to Admission medications   Medication Sig Start Date End Date Taking? Authorizing Provider  clobetasol ointment (TEMOVATE) 9.02 % Apply 1 application topically 2 (two) times daily.   Yes [provider]  dofetilide (TIKOSYN) 125 MCG capsule Take by mouth.   Yes [provider]  levothyroxine (SYNTHROID, LEVOTHROID) 75 MCG tablet Take 1 tablet by mouth daily. 07/27/15  Yes [provider]  metoprolol succinate (TOPROL-XL) 25 MG 24 hr tablet  07/17/15  Yes [provider]  pantoprazole (PROTONIX) 40 MG tablet  08/05/16  Yes [provider]  simvastatin (ZOCOR) 40 MG tablet Take 40 mg by mouth at bedtime.     Yes [provider]  traMADol (ULTRAM) 50 MG tablet Take 50 mg by mouth every 6 (six) hours as needed.     Yes [provider]  XARELTO 20 MG TABS tablet Take 1 tablet by mouth daily. 07/25/15  Yes [provider]  mupirocin nasal ointment (BACTROBAN) 2 % Apply in each nostril daily and apply to wound 3 times a day. 07/16/20   Margarette Canada, NP  rOPINIRole (REQUIP) 0.25 MG tablet Take by mouth. 07/12/15 03/24/20  [provider]    Family History Family History  Problem  Relation Age of Onset  . Dementia Mother   . Heart attack Father   . Stroke Sister   . Prostate cancer Brother   . Pancreatic cancer Sister     Social History Social History   Tobacco Use  . Smoking status: Current Every Day Smoker    Packs/day: 1.00    Years: 70.00    Pack years: 70.00    Types: Cigarettes  . Smokeless tobacco: Never Used  Vaping Use  . Vaping Use: Never used  Substance Use Topics  . Alcohol use: No    Alcohol/week: 0.0 standard drinks  . Drug use: No     Allergies   Codeine, Hydrocodone, and Onion   Review of Systems Review of Systems  Constitutional: Negative for activity change, appetite change and fever.  HENT: Negative for congestion and tinnitus.   Eyes: Negative for visual disturbance.  Respiratory: Negative for cough, shortness of breath and wheezing.   Cardiovascular: Negative for chest pain.  Gastrointestinal: Negative for vomiting.  Genitourinary: Negative for dysuria and frequency.  Musculoskeletal: Positive for arthralgias and myalgias. Negative for back pain and neck pain.       Patient has tenderness to her right forearm where the skin tears are and tenderness to her left knee.  Skin: Positive for color change and wound.       Skin tears on right forearm, skin tear/abrasion on left knee.  Bruising present around both wounds.  Neurological: Negative for dizziness, syncope and numbness.  Hematological: Negative.   Psychiatric/Behavioral: Negative.      Physical Exam Triage Vital Signs ED Triage Vitals [07/16/20 1629]  Enc Vitals Group     BP (!) 127/92     Pulse Rate (!) 120     Resp 18     Temp 98 F (36.7 C)     Temp Source Oral     SpO2 99 %     Weight 160 lb (72.6 kg)     Height 5\' 9"  (1.753 m)     Head Circumference      Peak Flow      Pain Score 0     Pain Loc      Pain Edu?      Excl. in [place]?    No data found.  Updated Vital Signs BP (!) 127/92 (BP Location: Left Arm)   Pulse (!) 120   Temp 98 F (36.7 C)  (Oral)   Resp 18   Ht 5\' 9"  (1.753 m)   Wt 160 lb (72.6 kg)   SpO2 99%   BMI 23.63 kg/m   Visual Acuity Right Eye Distance:   Left Eye Distance:   Bilateral Distance:    Right Eye Near:  Left Eye Near:    Bilateral Near:     Physical Exam Vitals and nursing note reviewed.  Constitutional:      General: She is not in acute distress.    Appearance: Normal appearance. She is not toxic-appearing.  HENT:     Head: Normocephalic and atraumatic.     Nose: Nose normal. No congestion or rhinorrhea.     Mouth/Throat:     Mouth: Mucous membranes are moist.     Pharynx: Oropharynx is clear. No oropharyngeal exudate or posterior oropharyngeal erythema.  Eyes:     General: No scleral icterus.    Extraocular Movements: Extraocular movements intact.     Conjunctiva/sclera: Conjunctivae normal.     Pupils: Pupils are equal, round, and reactive to light.  Cardiovascular:     Rate and Rhythm: Normal rate and regular rhythm.     Pulses: Normal pulses.     Heart sounds: Normal heart sounds. No murmur heard.   Pulmonary:     Effort: Pulmonary effort is normal.     Breath sounds: Normal breath sounds. No wheezing, rhonchi or rales.  Musculoskeletal:        General: Tenderness and signs of injury present. Normal range of motion.     Cervical back: Normal range of motion and neck supple.     Comments: There are 2 large skin tears on the right forearm on the volar aspect.  There is ecchymosis under a large geographic area surrounding the 2 skin tears.  The skin tear flaps are rolled up and tight.  The wound beds are pink and moist. There is another large skin tear/abrasion over the left knee.  The patient removed the skin herself.  There is some fine drip in the wound bed.  No surrounding erythema.  There is also ecchymosis geographically underneath the skin tear.  Skin:    General: Skin is warm and dry.     Capillary Refill: Capillary refill takes less than 2 seconds.     Findings: Bruising  present. No erythema.  Neurological:     General: No focal deficit present.     Mental Status: She is alert and oriented to person, place, and time.  Psychiatric:        Mood and Affect: Mood normal.        Behavior: Behavior normal.        Thought Content: Thought content normal.        Judgment: Judgment normal.      UC Treatments / Results  Labs (all labs ordered are listed, but only abnormal results are displayed) Labs Reviewed - No data to display  EKG   Radiology DG Forearm Right  Result Date: 07/16/2020 CLINICAL DATA:  Recent fall yesterday with forearm pain, initial encounter EXAM: RIGHT FOREARM - 2 VIEW COMPARISON:  None. FINDINGS: No acute fracture or dislocation is noted. Soft tissue irregularity is noted consistent with the recent history. IMPRESSION: No acute bony abnormality is noted. Electronically Signed   By: Inez Catalina M.D.   On: 07/16/2020 17:43   DG Tibia/Fibula Left  Result Date: 07/16/2020 CLINICAL DATA:  Recent fall with left lower leg pain, initial encounter EXAM: LEFT TIBIA AND FIBULA - 2 VIEW COMPARISON:  None. FINDINGS: There is no evidence of fracture or other focal bone lesions. Soft tissues are unremarkable. IMPRESSION: No acute abnormality noted. Electronically Signed   By: Inez Catalina M.D.   On: 07/16/2020 17:45    Procedures Procedures (including critical care time)  Medications  Ordered in UC Medications  tetanus & diphtheria toxoids (adult) (TENIVAC) injection 0.5 mL (0.5 mLs Intramuscular Given 07/16/20 1746)    Initial Impression / Assessment and Plan / UC Course  I have reviewed the triage vital signs and the nursing notes.  Pertinent labs & imaging results that were available during my care of the patient were reviewed by me and considered in my medical decision making (see chart for details).   Patient presents for evaluation after a fall.  She has several large skin tears with pink, moist, intact wound beds.  There is some grit  in all of the wounds from the concrete that she landed on.  She has tenderness to the right radius and left tibia under the skin tears to palpation.  Will image right forearm and left tib-fib.  Plan will be to provide tetanus update, clean the wounds and dressed them with antibiotic ointment.  Will send the patient home with mupirocin ointment to apply 3 times a day and use nonadherent dressings.  Radiology reports on x-rays negative for fracture.  Will DC home with mupirocin ointment and wound care instructions.   Final Clinical Impressions(s) / UC Diagnoses   Final diagnoses:  Fall, initial encounter  Skin tear of right upper arm without complication, initial encounter  Abrasion, left knee, initial encounter     Discharge Instructions     Keep the wounds clean and dry.  Apply the mupirocin ointment 3 times day for the next 7 days.  Use nonadherent dressings to protect the wound.  If you notice any redness, swelling, drainage, or fever return for reevaluation or see your primary care doctor.    ED Prescriptions    Medication Sig Dispense Auth. Provider   mupirocin nasal ointment (BACTROBAN) 2 % Apply in each nostril daily and apply to wound 3 times a day. 22 g Margarette Canada, NP     PDMP not reviewed this encounter.   Margarette Canada, NP 07/16/20 [phone]

## 2020-07-16 NOTE — ED Triage Notes (Signed)
Patient states that last night around 9pm she fell coming down 4 steps and missed the last one. States that she was taking laundry down the steps. She has 2 skin tears on her right arm. Patient also with a large skin tear to her left knee.

## 2020-07-16 NOTE — Discharge Instructions (Addendum)
Keep the wounds clean and dry.  Apply the mupirocin ointment 3 times day for the next 7 days.  Use nonadherent dressings to protect the wound.  If you notice any redness, swelling, drainage, or fever return for reevaluation or see your primary care doctor.

## 2020-07-16 NOTE — ED Notes (Signed)
Dressed wounds with bacitracin and non adherent pads and coban. I did apply an ace bandage lightly to arm to provide a more secure wrap to wounds.

## 2020-07-31 DIAGNOSIS — I700 Atherosclerosis of aorta: Secondary | ICD-10-CM | POA: Insufficient documentation

## 2020-08-30 ENCOUNTER — Other Ambulatory Visit: Payer: Self-pay | Admitting: Physical Medicine & Rehabilitation

## 2020-08-30 DIAGNOSIS — R29898 Other symptoms and signs involving the musculoskeletal system: Secondary | ICD-10-CM

## 2020-09-03 ENCOUNTER — Other Ambulatory Visit (HOSPITAL_COMMUNITY): Payer: Self-pay | Admitting: Physical Medicine & Rehabilitation

## 2020-09-03 ENCOUNTER — Other Ambulatory Visit: Payer: Self-pay | Admitting: Physical Medicine & Rehabilitation

## 2020-09-03 DIAGNOSIS — G8929 Other chronic pain: Secondary | ICD-10-CM

## 2020-09-03 DIAGNOSIS — M5442 Lumbago with sciatica, left side: Secondary | ICD-10-CM

## 2020-09-13 ENCOUNTER — Other Ambulatory Visit: Payer: Self-pay

## 2020-09-13 ENCOUNTER — Ambulatory Visit (HOSPITAL_COMMUNITY)
Admission: RE | Admit: 2020-09-13 | Discharge: 2020-09-13 | Disposition: A | Discharge status: Home / Self Care | Payer: Medicare Other | Source: Ambulatory Visit | Attending: Physical Medicine & Rehabilitation | Admitting: Physical Medicine & Rehabilitation

## 2020-09-13 DIAGNOSIS — G8929 Other chronic pain: Secondary | ICD-10-CM | POA: Insufficient documentation

## 2020-09-13 DIAGNOSIS — M5441 Lumbago with sciatica, right side: Secondary | ICD-10-CM | POA: Insufficient documentation

## 2020-09-13 DIAGNOSIS — M5442 Lumbago with sciatica, left side: Secondary | ICD-10-CM | POA: Insufficient documentation

## 2020-09-13 NOTE — Progress Notes (Signed)
Per order, changed device settings for MRI to DOO at 90 bpm   Tachy-therapies to off if applicable.   Will program device back to pre-MRI settings after completion of exam. 

## 2020-09-13 NOTE — Progress Notes (Signed)
Informed of MRI for today.   Device system confirmed to be MRI conditional, with implant date > 6 weeks ago and no evidence of abandoned or epicardial leads in review of most recent CXR Interrogation from today reviewed, pt is currently AP-VS at ~80 bpm Change device settings for MRI to DOO at 90 bpm  Tachy-therapies to off if applicable.  Program device back to pre-MRI settings after completion of exam.  Annamaria Helling  09/13/2020 12:24 PM

## 2020-10-29 DIAGNOSIS — E538 Deficiency of other specified B group vitamins: Secondary | ICD-10-CM | POA: Insufficient documentation

## 2021-04-26 ENCOUNTER — Other Ambulatory Visit: Payer: Self-pay

## 2021-04-26 ENCOUNTER — Ambulatory Visit
Admission: EM | Admit: 2021-04-26 | Discharge: 2021-04-26 | Disposition: A | Discharge status: Other Acute Inpatient Hospital | Payer: Medicare Other | Attending: Family Medicine | Admitting: Family Medicine

## 2021-04-26 DIAGNOSIS — I959 Hypotension, unspecified: Secondary | ICD-10-CM

## 2021-04-26 MED ORDER — SODIUM CHLORIDE 0.9 % IV BOLUS
1000.0000 mL | Freq: Once | INTRAVENOUS | Status: AC
  Administered 2021-04-26: 1000 mL via INTRAVENOUS

## 2021-04-26 NOTE — ED Notes (Signed)
Patient is being discharged from the Urgent Care and sent to the Emergency Department via EMS . Per Dr. Lacinda Axon, patient is in need of higher level of care due to hypotension, weakness. Patient is aware and verbalizes understanding of plan of care.  Vitals:   04/26/21 1326  BP: (!) 79/63  Pulse: 79  Resp: 18  Temp: 97.7 F (36.5 C)  SpO2: 98%

## 2021-04-26 NOTE — ED Notes (Signed)
Patient is being discharged from the Urgent Care and sent to the [facility] Emergency Department via EMS . Per Dr. Lacinda Axon, patient is in need of higher level of care due to hypotension and severe abdominal pain. Patient is aware and verbalizes understanding of plan of care.  Vitals:   04/26/21 1326  BP: (!) 79/63  Pulse: 79  Resp: 18  Temp: 97.7 F (36.5 C)  SpO2: 98%

## 2021-04-26 NOTE — ED Provider Notes (Signed)
[facility]    CSN: [id number] Arrival date & time: 04/26/21  1303      History   Chief Complaint Chief Complaint  Patient presents with   Weakness   Abdominal Pain    HPI 87 year old female presents with the above complaints.  Patient reports that she has not been feeling well for the past week.  She states that she feels weak and fatigued.  Feels like she is going to pass out when she stands up.  She has had an ongoing weight loss of approximately 15 pounds in the past few months.  She states that she is able to eat very little.  Patient had laboratory studies obtained yesterday.  Labs remarkable for suppressed TSH at 0.358.  Labs otherwise unremarkable.  Patient endorsed some abdominal pain as well.  Located in the upper abdomen.  No reports of fever.  Patient currently hypotensive at 79/63.  Past Medical History:  Diagnosis Date   Aortic regurgitation    Arthritis    Atrial fibrillation (HCC)    CAD (coronary artery disease)    Cancer (HCC)    vocal cord   Cardiac pacemaker    Erosive gastritis    Esophageal stricture    Hearing loss    Hiatal hernia    Hyperlipidemia    Hypertension    Hypothyroidism    Lipoma of thigh 2015   Outer   Pacemaker    Previous back surgery    Shingles    Thyroid disease    Vocal cord polyp    Wears dentures    Wears glasses     Patient Active Problem List   Diagnosis Date Noted   Soft tissue mass 09/18/2015   BP (high blood pressure) 09/13/2015   Adult hypothyroidism 09/13/2015   Sick sinus syndrome ([place]) 09/13/2015   Combined fat and carbohydrate induced hyperlipemia 02/27/2015   H/O malignant neoplasm of head and neck 02/08/2015   Atrial fibrillation ([place]) 10/30/2011    Past Surgical History:  Procedure Laterality Date   ABDOMINAL HYSTERECTOMY     BACK SURGERY     BALLOON DILATION N/A 03/14/2016   Procedure: BALLOON DILATION;  Surgeon: Manya Silvas, MD;  Location: [place];  Service: Endoscopy;   Laterality: N/A;   CATARACT EXTRACTION     CHOLECYSTECTOMY     COLONOSCOPY     COLONOSCOPY WITH PROPOFOL N/A 10/19/2015   Procedure: COLONOSCOPY WITH PROPOFOL;  Surgeon: Lollie Sails, MD;  Location: [facility] ENDOSCOPY;  Service: Endoscopy;  Laterality: N/A;   ESOPHAGOGASTRODUODENOSCOPY     ESOPHAGOGASTRODUODENOSCOPY  10/19/2015   Procedure: ESOPHAGOGASTRODUODENOSCOPY (EGD);  Surgeon: Lollie Sails, MD;  Location: [facility] ENDOSCOPY;  Service: Endoscopy;;   ESOPHAGOGASTRODUODENOSCOPY (EGD) WITH PROPOFOL N/A 03/14/2016   Procedure: ESOPHAGOGASTRODUODENOSCOPY (EGD) WITH PROPOFOL;  Surgeon: Manya Silvas, MD;  Location: [facility] ENDOSCOPY;  Service: Endoscopy;  Laterality: N/A;   GALLBLADDER SURGERY     THYROID SURGERY      OB History   No obstetric history on file.      Home Medications    Prior to Admission medications   Medication Sig Start Date End Date Taking? Authorizing Provider  clobetasol ointment (TEMOVATE) [id number] % Apply 1 application topically 2 (two) times daily.   Yes [provider]  dofetilide (TIKOSYN) 125 MCG capsule Take by mouth.   Yes [provider]  levothyroxine (SYNTHROID, LEVOTHROID) 75 MCG tablet Take 1 tablet by mouth daily. 07/27/15  Yes [provider]  metoprolol succinate (TOPROL-XL) 25  MG 24 hr tablet  07/17/15  Yes [provider]  mupirocin nasal ointment (BACTROBAN) 2 % Apply in each nostril daily and apply to wound 3 times a day. 07/16/20  Yes Margarette Canada, NP  pantoprazole (PROTONIX) 40 MG tablet  08/05/16  Yes [provider]  rOPINIRole (REQUIP) 0.25 MG tablet Take by mouth. 07/12/15 04/26/21 Yes [provider]  simvastatin (ZOCOR) 40 MG tablet Take 40 mg by mouth at bedtime.     Yes [provider]  traMADol (ULTRAM) 50 MG tablet Take 50 mg by mouth every 6 (six) hours as needed.     Yes [provider]  XARELTO 20 MG TABS tablet Take 1 tablet by mouth daily. 07/25/15  Yes  [provider]    Family History Family History  Problem Relation Age of Onset   Dementia Mother    Heart attack Father    Stroke Sister    Prostate cancer Brother    Pancreatic cancer Sister     Social History Social History   Tobacco Use   Smoking status: Every Day    Packs/day: 1.00    Years: 70.00    Pack years: 70.00    Types: Cigarettes   Smokeless tobacco: Never  Vaping Use   Vaping Use: Never used  Substance Use Topics   Alcohol use: No    Alcohol/week: 0.0 standard drinks   Drug use: No     Allergies   Codeine, Hydrocodone, and Onion   Review of Systems Review of Systems Per HPI  Physical Exam Triage Vital Signs ED Triage Vitals  Enc Vitals Group     BP 04/26/21 1326 (!) 79/63     Pulse Rate 04/26/21 1326 79     Resp 04/26/21 1326 18     Temp 04/26/21 1326 97.7 F (36.5 C)     Temp src --      SpO2 04/26/21 1326 98 %     Weight 04/26/21 1327 145 lb (65.8 kg)     Height --      Head Circumference --      Peak Flow --      Pain Score 04/26/21 1326 0     Pain Loc --      Pain Edu? --      Excl. in [place]? --     Updated Vital Signs BP (!) 79/63 (BP Location: Left Arm)   Pulse 79   Temp 97.7 F (36.5 C)   Resp 18   Wt 65.8 kg   SpO2 98%   BMI 21.41 kg/m   Visual Acuity Right Eye Distance:   Left Eye Distance:   Bilateral Distance:    Right Eye Near:   Left Eye Near:    Bilateral Near:     Physical Exam Vitals and nursing note reviewed.  Constitutional:      Appearance: She is ill-appearing.  HENT:     Head: Normocephalic and atraumatic.  Eyes:     General:        Right eye: No discharge.        Left eye: No discharge.     Conjunctiva/sclera: Conjunctivae normal.  Cardiovascular:     Rate and Rhythm: Normal rate and regular rhythm.  Pulmonary:     Effort: Pulmonary effort is normal.     Breath sounds: Normal breath sounds. No wheezing, rhonchi or rales.  Abdominal:     Palpations: Abdomen is soft.      Comments: Mild tenderness in  the epigastric region.  Neurological:     Mental Status: She is alert.     UC Treatments / Results  Labs (all labs ordered are listed, but only abnormal results are displayed) Labs Reviewed - No data to display  EKG   Radiology No results found.  Procedures Procedures (including critical care time)  Medications Ordered in UC Medications  sodium chloride 0.9 % bolus 1,000 mL (1,000 mLs Intravenous New Bag/Given 04/26/21 1341)    Initial Impression / Assessment and Plan / UC Course  I have reviewed the triage vital signs and the nursing notes.  Pertinent labs & imaging results that were available during my care of the patient were reviewed by me and considered in my medical decision making (see chart for details).    87 year old female presents for evaluation the above.  Patient is ill-appearing and is hypotensive.  This is an acute emergency and requires higher level of care.  IV placed and IV fluids started prior to discharge.  Patient being transported to the hospital via EMS.  Final Clinical Impressions(s) / UC Diagnoses   Final diagnoses:  Hypotension, unspecified hypotension type   Discharge Instructions   None    ED Prescriptions   None    PDMP not reviewed this encounter.   Coral Spikes, DO 04/26/21 1501

## 2021-04-26 NOTE — ED Triage Notes (Signed)
Patient states that she started abdominal pain, extreme weakness, states that the "sky goes black when I stand up." Has lost 15 pounds in the last few weeks and she won't eat.

## 2021-05-02 DIAGNOSIS — C4371 Malignant melanoma of right lower limb, including hip: Secondary | ICD-10-CM | POA: Insufficient documentation

## 2021-07-22 ENCOUNTER — Other Ambulatory Visit: Payer: Self-pay

## 2021-07-22 ENCOUNTER — Ambulatory Visit
Admission: RE | Admit: 2021-07-22 | Discharge: 2021-07-22 | Disposition: A | Discharge status: Home / Self Care | Payer: Medicare Other | Source: Ambulatory Visit | Attending: Internal Medicine | Admitting: Internal Medicine

## 2021-07-22 ENCOUNTER — Other Ambulatory Visit: Payer: Self-pay | Admitting: Internal Medicine

## 2021-07-22 DIAGNOSIS — R634 Abnormal weight loss: Secondary | ICD-10-CM

## 2021-07-22 DIAGNOSIS — R1084 Generalized abdominal pain: Secondary | ICD-10-CM

## 2021-07-22 LAB — POCT I-STAT CREATININE: Creatinine, Ser: 0.8 mg/dL (ref 0.44–1.00)

## 2021-07-22 MED ORDER — IOHEXOL 300 MG/ML  SOLN
100.0000 mL | Freq: Once | INTRAMUSCULAR | Status: AC | PRN
  Administered 2021-07-22: 75 mL via INTRAVENOUS

## 2022-11-06 ENCOUNTER — Encounter: Payer: Self-pay | Admitting: Emergency Medicine

## 2022-11-06 ENCOUNTER — Ambulatory Visit (INDEPENDENT_AMBULATORY_CARE_PROVIDER_SITE_OTHER): Payer: Medicare Other

## 2022-11-06 ENCOUNTER — Ambulatory Visit
Admission: EM | Admit: 2022-11-06 | Discharge: 2022-11-06 | Disposition: A | Discharge status: Home / Self Care | Payer: Medicare Other

## 2022-11-06 DIAGNOSIS — M67814 Other specified disorders of tendon, left shoulder: Secondary | ICD-10-CM | POA: Diagnosis not present

## 2022-11-06 DIAGNOSIS — M19012 Primary osteoarthritis, left shoulder: Secondary | ICD-10-CM | POA: Diagnosis not present

## 2022-11-06 DIAGNOSIS — M25512 Pain in left shoulder: Secondary | ICD-10-CM

## 2022-11-06 MED ORDER — DEXAMETHASONE SODIUM PHOSPHATE 10 MG/ML IJ SOLN
10.0000 mg | Freq: Once | INTRAMUSCULAR | Status: AC
  Administered 2022-11-06: 10 mg via INTRAMUSCULAR

## 2022-11-06 NOTE — Discharge Instructions (Signed)
-  The x-ray shows a lot of arthritis in your shoulder as well as inflammation/calcification around the tendons of the rotator cuff. - We have given you an injection of a corticosteroid in the clinic.  I would apply topical Voltaren gel to the shoulder, ice, heat.  Continue with your home pain medication as needed for severe pain.  Follow-up with [facility] clinic as we discussed as you may need an MRI to assess for possible rotator cuff tear and/or injection of a corticosteroid into your shoulder joint or even physical therapy.

## 2022-11-06 NOTE — ED Triage Notes (Signed)
Pt presents with left shoulder pain x 4 days. Pt denies any injury or chest pain. She states it hurts to move her arm.

## 2022-11-06 NOTE — ED Provider Notes (Signed)
[facility]    CSN: [id number] Arrival date & time: 11/06/22  1340      History   Chief Complaint Chief Complaint  Patient presents with   Shoulder Pain    HPI Elizabeth Boone is a 89 y.o. female presenting for 4 day history of left shoulder pain. Denies injury.  Pain does not radiate.  She has difficulty localizing the pain.  She has difficulty with range of motion of the shoulder and can barely lift it or reach.  Reports that she had to have help putting on her clothing.  Patient is currently taking hydrocodone 5-325 mg BID PRN chronic back pain. This was last filled 10/09/2022.  She has tried this medication as well as topical muscle rubs without much relief in her symptoms.   Denies any pain into her chest, palpitations, dizziness, shortness of breath, back pain, neck pain.  Medical history significant for atrial fibrillation, on long-term anticoagulants with Eliquis, coronary artery disease with pacemaker, hypertension, hyperlipidemia, thyroid disease and aortic regurgitation.  HPI  Past Medical History:  Diagnosis Date   Aortic regurgitation    Arthritis    Atrial fibrillation (HCC)    CAD (coronary artery disease)    Cancer (HCC)    vocal cord   Cardiac pacemaker    Erosive gastritis    Esophageal stricture    Hearing loss    Hiatal hernia    Hyperlipidemia    Hypertension    Hypothyroidism    Lipoma of thigh 2015   Outer   Pacemaker    Previous back surgery    Shingles    Thyroid disease    Vocal cord polyp    Wears dentures    Wears glasses     Patient Active Problem List   Diagnosis Date Noted   Soft tissue mass 09/18/2015   BP (high blood pressure) 09/13/2015   Adult hypothyroidism 09/13/2015   Sick sinus syndrome ([place]) 09/13/2015   Combined fat and carbohydrate induced hyperlipemia 02/27/2015   H/O malignant neoplasm of head and neck 02/08/2015   Atrial fibrillation ([place]) 10/30/2011    Past Surgical History:  Procedure Laterality  Date   ABDOMINAL HYSTERECTOMY     BACK SURGERY     BALLOON DILATION N/A 03/14/2016   Procedure: BALLOON DILATION;  Surgeon: Manya Silvas, MD;  Location: [place];  Service: Endoscopy;  Laterality: N/A;   CATARACT EXTRACTION     CHOLECYSTECTOMY     COLONOSCOPY     COLONOSCOPY WITH PROPOFOL N/A 10/19/2015   Procedure: COLONOSCOPY WITH PROPOFOL;  Surgeon: Lollie Sails, MD;  Location: [facility] ENDOSCOPY;  Service: Endoscopy;  Laterality: N/A;   ESOPHAGOGASTRODUODENOSCOPY     ESOPHAGOGASTRODUODENOSCOPY  10/19/2015   Procedure: ESOPHAGOGASTRODUODENOSCOPY (EGD);  Surgeon: Lollie Sails, MD;  Location: [facility] ENDOSCOPY;  Service: Endoscopy;;   ESOPHAGOGASTRODUODENOSCOPY (EGD) WITH PROPOFOL N/A 03/14/2016   Procedure: ESOPHAGOGASTRODUODENOSCOPY (EGD) WITH PROPOFOL;  Surgeon: Manya Silvas, MD;  Location: [facility] ENDOSCOPY;  Service: Endoscopy;  Laterality: N/A;   GALLBLADDER SURGERY     THYROID SURGERY      OB History   No obstetric history on file.      Home Medications    Prior to Admission medications   Medication Sig Start Date End Date Taking? Authorizing Provider  cyanocobalamin (VITAMIN B12) 1000 MCG/ML injection Inject into the muscle. 04/05/19  Yes [provider]  HYDROcodone-acetaminophen (NORCO/VICODIN) 5-325 MG tablet Take 1 tablet by mouth 2 (two) times daily as needed for moderate pain (  chronic back pain).   Yes [provider]  clobetasol ointment (TEMOVATE) 4.58 % Apply 1 application topically 2 (two) times daily.    [provider]  dofetilide (TIKOSYN) 125 MCG capsule Take by mouth.    [provider]  ELIQUIS 5 MG TABS tablet Take 5 mg by mouth 2 (two) times daily.    [provider]  levothyroxine (SYNTHROID, LEVOTHROID) 75 MCG tablet Take 1 tablet by mouth daily. 07/27/15   [provider]  metoprolol succinate (TOPROL-XL) 25 MG 24 hr tablet  07/17/15   [provider]  mupirocin nasal ointment  (BACTROBAN) 2 % Apply in each nostril daily and apply to wound 3 times a day. 07/16/20   Margarette Canada, NP  pantoprazole (PROTONIX) 40 MG tablet  08/05/16   [provider]  rOPINIRole (REQUIP) 0.25 MG tablet Take by mouth. 07/12/15 04/26/21  [provider]  simvastatin (ZOCOR) 40 MG tablet Take 40 mg by mouth at bedtime.      [provider]  XARELTO 20 MG TABS tablet Take 1 tablet by mouth daily. 07/25/15   [provider]    Family History Family History  Problem Relation Age of Onset   Dementia Mother    Heart attack Father    Stroke Sister    Prostate cancer Brother    Pancreatic cancer Sister     Social History Social History   Tobacco Use   Smoking status: Every Day    Packs/day: 1.00    Years: 70.00    Total pack years: 70.00    Types: Cigarettes   Smokeless tobacco: Never  Vaping Use   Vaping Use: Never used  Substance Use Topics   Alcohol use: No    Alcohol/week: 0.0 standard drinks of alcohol   Drug use: No     Allergies   Codeine, Hydrocodone, and Onion   Review of Systems Review of Systems  Respiratory:  Negative for shortness of breath.   Cardiovascular:  Negative for chest pain and palpitations.  Musculoskeletal:  Positive for arthralgias. Negative for back pain, gait problem, joint swelling, neck pain and neck stiffness.  Skin:  Negative for color change, rash and wound.  Neurological:  Negative for weakness and numbness.     Physical Exam Triage Vital Signs ED Triage Vitals  Enc Vitals Group     BP 11/06/22 1452 130/79     Pulse Rate 11/06/22 1452 80     Resp 11/06/22 1452 16     Temp 11/06/22 1452 97.8 F (36.6 C)     Temp Source 11/06/22 1452 Oral     SpO2 11/06/22 1452 96 %     Weight --      Height --      Head Circumference --      Peak Flow --      Pain Score 11/06/22 1449 8     Pain Loc --      Pain Edu? --      Excl. in [place]? --    No data found.  Updated Vital Signs BP 130/79 (BP  Location: Left Arm)   Pulse 80   Temp 97.8 F (36.6 C) (Oral)   Resp 16   SpO2 96%    Physical Exam Vitals and nursing note reviewed.  Constitutional:      General: She is not in acute distress.    Appearance: Normal appearance. She is not ill-appearing or toxic-appearing.  HENT:     Head: Normocephalic and  atraumatic.  Eyes:     General: No scleral icterus.       Right eye: No discharge.        Left eye: No discharge.     Conjunctiva/sclera: Conjunctivae normal.  Cardiovascular:     Rate and Rhythm: Normal rate and regular rhythm.     Heart sounds: Normal heart sounds.  Pulmonary:     Effort: Pulmonary effort is normal. No respiratory distress.     Breath sounds: Normal breath sounds.  Musculoskeletal:     Left shoulder: Tenderness (AC joint, posterior scapula, lateral deltoid) and bony tenderness present. No swelling or deformity. Decreased range of motion (significantly reduced ROM. Patient barely moves shoulder due to guarding/pain). Normal pulse.     Cervical back: Neck supple.  Skin:    General: Skin is dry.  Neurological:     General: No focal deficit present.     Mental Status: She is alert. Mental status is at baseline.     Motor: No weakness.     Gait: Gait normal.  Psychiatric:        Mood and Affect: Mood normal.        Behavior: Behavior normal.        Thought Content: Thought content normal.      UC Treatments / Results  Labs (all labs ordered are listed, but only abnormal results are displayed) Labs Reviewed - No data to display  EKG   Radiology DG Shoulder Left  Result Date: 11/06/2022 CLINICAL DATA:  Pain for 4 days.  No known injury. EXAM: LEFT SHOULDER - 2+ VIEW COMPARISON:  None Available. FINDINGS: There is diffuse decreased bone mineralization. Mild-to-moderate glenohumeral and acromioclavicular joint space narrowing and peripheral osteophytosis. There is high-grade curvilinear calcification superior to the humeral head throughout the course  of the expected superior rotator cuff musculotendinous junction through the tendon footprint, spanning an approximate 4 cm length and measuring approximately 0.8 cm in craniocaudal thickness. This is suggestive of high-grade chronic calcific tendinosis. No acute fracture or dislocation. Partial visualization of left chest wall cardiac AICD device with lead extending off of the peripheral plane of view. Moderate to high-grade atherosclerotic calcifications within the aortic arch. Surgical clips overlie the anterior right and left neck base. IMPRESSION: 1. Mild-to-moderate glenohumeral and acromioclavicular osteoarthritis. 2. High-grade chronic calcific tendinosis of the superior rotator cuff. Electronically Signed   By: Yvonne Kendall M.D.   On: 11/06/2022 15:33    Procedures Procedures (including critical care time)  Medications Ordered in UC Medications  dexamethasone (DECADRON) injection 10 mg (10 mg Intramuscular Given 11/06/22 1544)    Initial Impression / Assessment and Plan / UC Course  I have reviewed the triage vital signs and the nursing notes.  Pertinent labs & imaging results that were available during my care of the patient were reviewed by me and considered in my medical decision making (see chart for details).   89 y/o female presents for 4 day history of left shoulder pain. Denies injury.   Of note, she does take hydrocodone BID PRN for chronic back pain and filled prescription less than 1 month ago for 60 tablets.  VSS and patient is overall well appearing.  Patient has significantly reduced range of motion of left shoulder.  Tenderness palpation over the [facility] joint, posterior scapula and lateral deltoid.  X-ray of left shoulder obtained.  X-ray today shows mild to moderate glenohumeral and acromioclavicular osteoarthritis.  She also has high-grade chronic calcific tendinosis of the superior rotator cuff.  Discussed all of these results with patient and family member.  Patient  request a corticosteroid injection.  She was given 10 mg IM dexamethasone in clinic.  Advised her to continue with her home hydrocodone as needed, topical Voltaren gel and muscle rubs, ice.  She has been seen by [facility] orthopedics before and I advised her to follow up with them again.  She may need to have an MRI to assess for possible degenerative rotator cuff tear and she may also benefit from corticosteroid injection into the joint, physical therapy, etc.  Patient was provided with a sling for comfort but advised her to try to take it out of the sling and move it around as tolerated.  Follow-up with [facility] soon as possible.   Final Clinical Impressions(s) / UC Diagnoses   Final diagnoses:  Acute pain of left shoulder  Tendinosis of left rotator cuff  Arthritis of left shoulder region     Discharge Instructions      -The x-ray shows a lot of arthritis in your shoulder as well as inflammation/calcification around the tendons of the rotator cuff. - We have given you an injection of a corticosteroid in the clinic.  I would apply topical Voltaren gel to the shoulder, ice, heat.  Continue with your home pain medication as needed for severe pain.  Follow-up with [facility] clinic as we discussed as you may need an MRI to assess for possible rotator cuff tear and/or injection of a corticosteroid into your shoulder joint or even physical therapy.     ED Prescriptions   None    I have reviewed the PDMP during this encounter.   Danton Clap, PA-C 11/06/22 [phone]

## 2022-11-28 ENCOUNTER — Other Ambulatory Visit: Payer: Self-pay | Admitting: Orthopedic Surgery

## 2022-11-28 DIAGNOSIS — M79602 Pain in left arm: Secondary | ICD-10-CM

## 2022-11-28 DIAGNOSIS — M7532 Calcific tendinitis of left shoulder: Secondary | ICD-10-CM

## 2022-12-05 ENCOUNTER — Ambulatory Visit
Admission: RE | Admit: 2022-12-05 | Discharge: 2022-12-05 | Disposition: A | Discharge status: Home / Self Care | Payer: Medicare Other | Source: Ambulatory Visit | Attending: Orthopedic Surgery | Admitting: Orthopedic Surgery

## 2022-12-05 DIAGNOSIS — M7532 Calcific tendinitis of left shoulder: Secondary | ICD-10-CM

## 2022-12-05 DIAGNOSIS — M79602 Pain in left arm: Secondary | ICD-10-CM

## 2022-12-08 DIAGNOSIS — R911 Solitary pulmonary nodule: Secondary | ICD-10-CM | POA: Insufficient documentation

## 2023-05-29 ENCOUNTER — Other Ambulatory Visit: Payer: Self-pay | Admitting: Physician Assistant

## 2023-05-29 ENCOUNTER — Ambulatory Visit
Admission: RE | Admit: 2023-05-29 | Discharge: 2023-05-29 | Disposition: A | Discharge status: Home / Self Care | Payer: Medicare Other | Source: Ambulatory Visit | Attending: Physician Assistant | Admitting: Physician Assistant

## 2023-05-29 DIAGNOSIS — R1013 Epigastric pain: Secondary | ICD-10-CM

## 2023-05-29 DIAGNOSIS — R519 Headache, unspecified: Secondary | ICD-10-CM

## 2023-05-29 DIAGNOSIS — R1032 Left lower quadrant pain: Secondary | ICD-10-CM

## 2023-05-29 MED ORDER — IOHEXOL 300 MG/ML  SOLN
100.0000 mL | Freq: Once | INTRAMUSCULAR | Status: AC | PRN
  Administered 2023-05-29: 100 mL via INTRAVENOUS

## 2023-12-07 ENCOUNTER — Ambulatory Visit: Admission: EM | Admit: 2023-12-07 | Discharge: 2023-12-07 | Disposition: A | Discharge status: Home / Self Care

## 2023-12-07 ENCOUNTER — Ambulatory Visit (INDEPENDENT_AMBULATORY_CARE_PROVIDER_SITE_OTHER)

## 2023-12-07 DIAGNOSIS — J441 Chronic obstructive pulmonary disease with (acute) exacerbation: Secondary | ICD-10-CM | POA: Diagnosis not present

## 2023-12-07 DIAGNOSIS — R051 Acute cough: Secondary | ICD-10-CM

## 2023-12-07 DIAGNOSIS — M62831 Muscle spasm of calf: Secondary | ICD-10-CM | POA: Diagnosis not present

## 2023-12-07 DIAGNOSIS — I7121 Aneurysm of the ascending aorta, without rupture: Secondary | ICD-10-CM | POA: Insufficient documentation

## 2023-12-07 DIAGNOSIS — J381 Polyp of vocal cord and larynx: Secondary | ICD-10-CM | POA: Insufficient documentation

## 2023-12-07 DIAGNOSIS — R490 Dysphonia: Secondary | ICD-10-CM | POA: Diagnosis not present

## 2023-12-07 MED ORDER — DOXYCYCLINE HYCLATE 100 MG PO CAPS: 100.0000 mg | ORAL_CAPSULE | Freq: Two times a day (BID) | ORAL | 0 refills | Status: AC

## 2023-12-07 MED ORDER — ALBUTEROL SULFATE HFA 108 (90 BASE) MCG/ACT IN AERS
1.0000 | INHALATION_SPRAY | Freq: Four times a day (QID) | RESPIRATORY_TRACT | 0 refills | Status: AC | PRN
Start: 2023-12-07 — End: ?

## 2023-12-07 MED ORDER — PREDNISONE 20 MG PO TABS: 40.0000 mg | ORAL_TABLET | Freq: Every day | ORAL | 0 refills | Status: AC

## 2023-12-07 NOTE — ED Provider Notes (Signed)
 [facility]    CSN: [id number] Arrival date & time: 12/07/23  1840      History   Chief Complaint Chief Complaint  Patient presents with   Cough   Shortness of Breath   Nasal Congestion   Fatigue    HPI Elizabeth Boone is a 89 y.o. female with history of COPD, atrial fibrillation, pacemaker, CAD, laryngeal cancer, hiatal hernia, sick sinus syndrome, hypertension, melanoma, hypothyroidism, thoracic aortic aneurysm, and long term anticoagulation.   HPI  Past Medical History:  Diagnosis Date   Aortic regurgitation    Arthritis    Atrial fibrillation (HCC)    CAD (coronary artery disease)    Cancer (HCC)    vocal cord   Cardiac pacemaker    Erosive gastritis    Esophageal stricture    Hearing loss    Hiatal hernia    Hyperlipidemia    Hypertension    Hypothyroidism    Lipoma of thigh 2015   Outer   Pacemaker    Previous back surgery    Shingles    Thyroid disease    Vocal cord polyp    Wears dentures    Wears glasses     Patient Active Problem List   Diagnosis Date Noted   Thoracic ascending aortic aneurysm (HCC) 12/07/2023   Vocal cord polyp 12/07/2023   Pulmonary nodule 12/08/2022   Malignant melanoma of leg, right (HCC) 05/02/2021   B12 deficiency 10/29/2020   Aortic atherosclerosis (HCC) 07/31/2020   Major depressive disorder, recurrent, mild (HCC) 04/26/2020   Chronic bilateral low back pain with bilateral sciatica 01/06/2020   Medicare annual wellness visit, initial 04/15/2018   NSVT (nonsustained ventricular tachycardia) (HCC) 06/04/2017   Cardiac pacemaker in situ 04/15/2017   Benign essential hypertension 04/09/2017   Panlobular emphysema (HCC) 04/09/2017   Dysphagia, oropharyngeal 11/29/2015   Soft tissue mass 09/18/2015   BP (high blood pressure) 09/13/2015   Acquired hypothyroidism 09/13/2015   Sick sinus syndrome (HCC) 09/13/2015   Hyperlipidemia, mixed 02/27/2015   H/O malignant neoplasm of head and neck 02/08/2015   H/O  laryngeal cancer 02/08/2015   On dofetilide therapy 09/29/2014   Paroxysmal atrial fibrillation (HCC) 10/30/2011    Past Surgical History:  Procedure Laterality Date   ABDOMINAL HYSTERECTOMY     BACK SURGERY     BALLOON DILATION N/A 03/14/2016   Procedure: BALLOON DILATION;  Surgeon: Scot Jun, MD;  Location: [facility] ENDOSCOPY;  Service: Endoscopy;  Laterality: N/A;   CATARACT EXTRACTION     CHOLECYSTECTOMY     COLONOSCOPY     COLONOSCOPY WITH PROPOFOL N/A 10/19/2015   Procedure: COLONOSCOPY WITH PROPOFOL;  Surgeon: Christena Deem, MD;  Location: [facility] ENDOSCOPY;  Service: Endoscopy;  Laterality: N/A;   ESOPHAGOGASTRODUODENOSCOPY     ESOPHAGOGASTRODUODENOSCOPY  10/19/2015   Procedure: ESOPHAGOGASTRODUODENOSCOPY (EGD);  Surgeon: Christena Deem, MD;  Location: [facility] ENDOSCOPY;  Service: Endoscopy;;   ESOPHAGOGASTRODUODENOSCOPY (EGD) WITH PROPOFOL N/A 03/14/2016   Procedure: ESOPHAGOGASTRODUODENOSCOPY (EGD) WITH PROPOFOL;  Surgeon: Scot Jun, MD;  Location: [facility] ENDOSCOPY;  Service: Endoscopy;  Laterality: N/A;   GALLBLADDER SURGERY     THYROID SURGERY      OB History   No obstetric history on file.      Home Medications    Prior to Admission medications   Medication Sig Start Date End Date Taking? Authorizing Provider  albuterol (VENTOLIN HFA) 108 (90 Base) MCG/ACT inhaler Inhale 1-2 puffs into the lungs every 6 (six) hours as needed for  wheezing or shortness of breath. 12/07/23  Yes Eusebio Friendly B, PA-C  clobetasol ointment (TEMOVATE) 0.05 % Apply 1 application topically 2 (two) times daily.   Yes [provider]  cyanocobalamin (VITAMIN B12) 1000 MCG/ML injection Inject into the muscle. 04/05/19  Yes [provider]  dofetilide (TIKOSYN) 125 MCG capsule Take by mouth.   Yes [provider]  doxycycline (VIBRAMYCIN) 100 MG capsule Take 1 capsule (100 mg total) by mouth 2 (two) times daily for 7 days. 12/07/23 12/14/23 Yes Eusebio Friendly B, PA-C   ELIQUIS 5 MG TABS tablet Take 5 mg by mouth 2 (two) times daily.   Yes [provider]  gabapentin (NEURONTIN) 100 MG capsule Take 100 mg by mouth daily.   Yes [provider]  HYDROcodone-acetaminophen (NORCO/VICODIN) 5-325 MG tablet Take 1 tablet by mouth 2 (two) times daily as needed for moderate pain (chronic back pain).   Yes [provider]  levothyroxine (SYNTHROID, LEVOTHROID) 75 MCG tablet Take 1 tablet by mouth daily. 07/27/15  Yes [provider]  metoprolol succinate (TOPROL-XL) 25 MG 24 hr tablet  07/17/15  Yes [provider]  mupirocin nasal ointment (BACTROBAN) 2 % Apply in each nostril daily and apply to wound 3 times a day. 07/16/20  Yes Becky Augusta, NP  pantoprazole (PROTONIX) 40 MG tablet  08/05/16  Yes [provider]  predniSONE (DELTASONE) 20 MG tablet Take 2 tablets (40 mg total) by mouth daily for 5 days. 12/07/23 12/12/23 Yes Shirlee Latch, PA-C  simvastatin (ZOCOR) 40 MG tablet Take 40 mg by mouth at bedtime.     Yes [provider]  rOPINIRole (REQUIP) 0.25 MG tablet Take by mouth. 07/12/15 12/07/23  [provider]  XARELTO 20 MG TABS tablet Take 1 tablet by mouth daily. 07/25/15   [provider]    Family History Family History  Problem Relation Age of Onset   Dementia Mother    Heart attack Father    Stroke Sister    Prostate cancer Brother    Pancreatic cancer Sister     Social History Social History   Tobacco Use   Smoking status: Every Day    Current packs/day: 1.00    Average packs/day: 1 pack/day for 70.0 years (70.0 ttl pk-yrs)    Types: Cigarettes   Smokeless tobacco: Never  Vaping Use   Vaping status: Never Used  Substance Use Topics   Alcohol use: No    Alcohol/week: 0.0 standard drinks of alcohol   Drug use: No     Allergies   Codeine, Hydrocodone, and Onion   Review of Systems Review of Systems  Constitutional:  Positive for fatigue. Negative for  chills, diaphoresis and fever.  HENT:  Positive for congestion, rhinorrhea, sinus pressure (right maxillary sinus) and voice change. Negative for ear pain, sinus pain and sore throat.   Respiratory:  Positive for cough and shortness of breath.   Cardiovascular:  Negative for chest pain.  Gastrointestinal:  Negative for abdominal pain, diarrhea and vomiting.  Musculoskeletal:  Positive for myalgias (cramp of left calf). Negative for arthralgias.  Skin:  Negative for rash.  Neurological:  Negative for weakness and headaches.  Hematological:  Negative for adenopathy.     Physical Exam Triage Vital Signs ED Triage Vitals  Encounter Vitals Group     BP 12/07/23 1905 116/79     Systolic BP Percentile --      Diastolic BP Percentile --      Pulse Rate 12/07/23  1905 78     Resp 12/07/23 1905 18     Temp 12/07/23 1905 97.6 F (36.4 C)     Temp Source 12/07/23 1905 Temporal     SpO2 12/07/23 1905 95 %     Weight 12/07/23 1905 137 lb (62.1 kg)     Height --      Head Circumference --      Peak Flow --      Pain Score 12/07/23 1910 8     Pain Loc --      Pain Education --      Exclude from Growth Chart --    No data found.  Updated Vital Signs BP 116/79 (BP Location: Left Arm)   Pulse 78   Temp 97.6 F (36.4 C) (Temporal)   Resp 18   Wt 137 lb (62.1 kg)   SpO2 95%   BMI 20.23 kg/m     Physical Exam Vitals and nursing note reviewed.  Constitutional:      General: She is not in acute distress.    Appearance: Normal appearance. She is not ill-appearing or toxic-appearing.     Comments: Voice is hoarse  HENT:     Head: Normocephalic and atraumatic.     Right Ear: Tympanic membrane, ear canal and external ear normal.     Left Ear: Tympanic membrane, ear canal and external ear normal.     Nose: Congestion present.     Mouth/Throat:     Mouth: Mucous membranes are moist.     Pharynx: Oropharynx is clear.  Eyes:     General: No scleral icterus.       Right eye: No  discharge.        Left eye: No discharge.     Conjunctiva/sclera: Conjunctivae normal.  Cardiovascular:     Rate and Rhythm: Normal rate. Rhythm irregular.     Heart sounds: Normal heart sounds.  Pulmonary:     Effort: Pulmonary effort is normal. No respiratory distress.     Breath sounds: Wheezing (few scattered wheezes) present.  Musculoskeletal:     Cervical back: Neck supple.  Skin:    General: Skin is dry.  Neurological:     General: No focal deficit present.     Mental Status: She is alert. Mental status is at baseline.     Motor: No weakness.     Gait: Gait normal.  Psychiatric:        Mood and Affect: Mood normal.        Behavior: Behavior normal.      UC Treatments / Results  Labs (all labs ordered are listed, but only abnormal results are displayed) Labs Reviewed - No data to display  EKG   Radiology No results found.  Procedures Procedures (including critical care time)  Medications Ordered in UC Medications - No data to display  Initial Impression / Assessment and Plan / UC Course  I have reviewed the triage vital signs and the nursing notes.  Pertinent labs & imaging results that were available during my care of the patient were reviewed by me and considered in my medical decision making (see chart for details).     *** Final Clinical Impressions(s) / UC Diagnoses   Final diagnoses:  Acute cough  COPD exacerbation (HCC)  Voice hoarseness  Muscle spasm of left calf     Discharge Instructions      -X-ray looks good on initial reading.  As discussed, it will be read by the radiologist  and I will contact you tomorrow if the radiologist sees pneumonia. - COPD is flared up.  I sent antibiotic, corticosteroid and inhaler to the pharmacy.  Also consider using Mucinex, increasing rest and fluids. - Calf pain seems most consistent with a cramp or spasm.  I have advised warm compresses, Tylenol, stretching and massage.  If associated swelling,  redness or worsening pain seek immediate reevaluation in the ED to include possible ultrasound to rule out blood clot but very low suspicion for that especially since you are on blood thinner anyway.     ED Prescriptions     Medication Sig Dispense Auth. Provider   predniSONE (DELTASONE) 20 MG tablet Take 2 tablets (40 mg total) by mouth daily for 5 days. 10 tablet Eusebio Friendly B, PA-C   doxycycline (VIBRAMYCIN) 100 MG capsule Take 1 capsule (100 mg total) by mouth 2 (two) times daily for 7 days. 14 capsule Eusebio Friendly B, PA-C   albuterol (VENTOLIN HFA) 108 (90 Base) MCG/ACT inhaler Inhale 1-2 puffs into the lungs every 6 (six) hours as needed for wheezing or shortness of breath. 1 g Shirlee Latch, PA-C      PDMP not reviewed this encounter.

## 2023-12-07 NOTE — ED Triage Notes (Signed)
 Pt c/o cough,congestion,sob & fatigue x1 wk. Also c/o L calf pain x2 days. States hard to walk. Has been using expired levaquin w/o relief.

## 2023-12-07 NOTE — Discharge Instructions (Addendum)
-  X-ray looks good on initial reading.  As discussed, it will be read by the radiologist and I will contact you tomorrow if the radiologist sees pneumonia. - COPD is flared up.  I sent antibiotic, corticosteroid and inhaler to the pharmacy.  Also consider using Mucinex, increasing rest and fluids. - Calf pain seems most consistent with a cramp or spasm.  I have advised warm compresses, Tylenol, stretching and massage.  If associated swelling, redness or worsening pain seek immediate reevaluation in the ED to include possible ultrasound to rule out blood clot but very low suspicion for that especially since you are on blood thinner anyway.

## 2023-12-17 ENCOUNTER — Other Ambulatory Visit
Admission: RE | Admit: 2023-12-17 | Discharge: 2023-12-17 | Disposition: A | Discharge status: Home / Self Care | Source: Ambulatory Visit | Attending: Physician Assistant | Admitting: Physician Assistant

## 2023-12-17 ENCOUNTER — Ambulatory Visit
Admission: RE | Admit: 2023-12-17 | Discharge: 2023-12-17 | Disposition: A | Discharge status: Home / Self Care | Source: Ambulatory Visit | Attending: Physician Assistant | Admitting: Physician Assistant

## 2023-12-17 ENCOUNTER — Other Ambulatory Visit: Payer: Self-pay | Admitting: Physician Assistant

## 2023-12-17 DIAGNOSIS — R252 Cramp and spasm: Secondary | ICD-10-CM

## 2023-12-17 DIAGNOSIS — M7989 Other specified soft tissue disorders: Secondary | ICD-10-CM | POA: Diagnosis present

## 2023-12-17 LAB — D-DIMER, QUANTITATIVE: D-Dimer, Quant: 1.23 ug{FEU}/mL — ABNORMAL HIGH (ref 0.00–0.50)

## 2023-12-18 ENCOUNTER — Ambulatory Visit

## 2023-12-18 ENCOUNTER — Other Ambulatory Visit
